# Patient Record
Sex: Male | Born: 1965 | Race: White | Hispanic: No | Marital: Married | Smoking: Former smoker
Health system: Southern US, Community
[De-identification: ages and names within clinical notes are randomized; demographics above are authoritative.]

## PROBLEM LIST (undated history)

## (undated) DIAGNOSIS — F419 Anxiety disorder, unspecified: Secondary | ICD-10-CM

## (undated) DIAGNOSIS — K219 Gastro-esophageal reflux disease without esophagitis: Secondary | ICD-10-CM

## (undated) DIAGNOSIS — I10 Essential (primary) hypertension: Secondary | ICD-10-CM

## (undated) DIAGNOSIS — Z87891 Personal history of nicotine dependence: Secondary | ICD-10-CM

## (undated) DIAGNOSIS — C439 Malignant melanoma of skin, unspecified: Secondary | ICD-10-CM

## (undated) HISTORY — DX: Personal history of nicotine dependence: Z87.891

## (undated) HISTORY — DX: Gastro-esophageal reflux disease without esophagitis: K21.9

## (undated) HISTORY — DX: Malignant melanoma of skin, unspecified: C43.9

## (undated) HISTORY — PX: SKIN CANCER EXCISION: SHX779

## (undated) HISTORY — DX: Anxiety disorder, unspecified: F41.9

---

## 2009-11-25 ENCOUNTER — Emergency Department (HOSPITAL_COMMUNITY): Admission: EM | Admit: 2009-11-25 | Discharge: 2009-11-25 | Payer: Self-pay | Admitting: Family Medicine

## 2011-01-03 LAB — POCT I-STAT, CHEM 8
BUN: 8 mg/dL (ref 6–23)
Calcium, Ion: 1.09 mmol/L — ABNORMAL LOW (ref 1.12–1.32)
TCO2: 23 mmol/L (ref 0–100)

## 2011-10-14 ENCOUNTER — Emergency Department (INDEPENDENT_AMBULATORY_CARE_PROVIDER_SITE_OTHER)
Admission: EM | Admit: 2011-10-14 | Discharge: 2011-10-14 | Disposition: A | Discharge status: Home / Self Care | Payer: Self-pay | Source: Home / Self Care

## 2011-10-14 ENCOUNTER — Other Ambulatory Visit: Payer: Self-pay

## 2011-10-14 ENCOUNTER — Encounter: Payer: Self-pay | Admitting: *Deleted

## 2011-10-14 DIAGNOSIS — R9431 Abnormal electrocardiogram [ECG] [EKG]: Secondary | ICD-10-CM

## 2011-10-14 DIAGNOSIS — J029 Acute pharyngitis, unspecified: Secondary | ICD-10-CM

## 2011-10-14 DIAGNOSIS — H109 Unspecified conjunctivitis: Secondary | ICD-10-CM

## 2011-10-14 DIAGNOSIS — I10 Essential (primary) hypertension: Secondary | ICD-10-CM

## 2011-10-14 HISTORY — DX: Essential (primary) hypertension: I10

## 2011-10-14 LAB — POCT RAPID STREP A: Streptococcus, Group A Screen (Direct): NEGATIVE

## 2011-10-14 MED ORDER — SULFACETAMIDE SODIUM 10 % OP SOLN: OPHTHALMIC | Status: DC

## 2011-10-14 NOTE — ED Provider Notes (Signed)
History     CSN: [id number]  Arrival date & time 10/14/11  0840   None     Chief Complaint  Patient presents with  . Sore Throat    (Consider location/radiation/quality/duration/timing/severity/associated sxs/prior treatment) HPI Comments: Pt presents with c/o sore throat and Lt eye redness. Sore throat began 3 days ago and is worse with swallowing. No fever, chills, nasal congestion or post nasal drainage. Lt eye turned red yesterday and is crusted shut in mornings. No daytime drainage, pain or visual change. His BP is noted to be elevated. Pt has a hx of HTN. He was seen at this urgent care Feb 2011 with HTN and had and abn EKG. He was advised to f/u with cardiology.  Pt states he did not take the BP medications as prescribed, and admits that he still has them at home. He also did not f/u with a PCP or Cardiologist. He denies chest pain, palpitations or dyspnea.    Past Medical History  Diagnosis Date  . Hypertension     History reviewed. No pertinent past surgical history.  History reviewed. No pertinent family history.  History  Substance Use Topics  . Smoking status: Current Everyday Smoker  . Smokeless tobacco: Not on file  . Alcohol Use: Yes     socially      Review of Systems  Constitutional: Negative for fever, chills and fatigue.  HENT: Positive for sore throat. Negative for ear pain, rhinorrhea, trouble swallowing and postnasal drip.   Eyes: Positive for redness. Negative for photophobia, pain, itching and visual disturbance.  Respiratory: Negative for cough and shortness of breath.   Cardiovascular: Negative for chest pain, palpitations and leg swelling.    Allergies  Review of patient's allergies indicates no known allergies.  Home Medications   Current Outpatient Rx  Name Route Sig Dispense Refill  . SULFACETAMIDE SODIUM 10 % OP SOLN  2 gtts TID Lt eye x 7 d 5 mL 0    BP 152/100  Pulse 72  Temp(Src) 98.2 F (36.8 C) (Oral)  Resp 20  SpO2  97%  Physical Exam  Nursing note and vitals reviewed. Constitutional: He appears well-developed and well-nourished. No distress.  HENT:  Head: Normocephalic and atraumatic.  Right Ear: Tympanic membrane, external ear and ear canal normal.  Left Ear: Tympanic membrane, external ear and ear canal normal.  Nose: Nose normal.  Mouth/Throat: Uvula is midline and mucous membranes are normal. No oral lesions. No uvula swelling. Posterior oropharyngeal erythema present. No oropharyngeal exudate, posterior oropharyngeal edema or tonsillar abscesses.  Eyes: EOM and lids are normal. Pupils are equal, round, and reactive to light. Right conjunctiva is not injected. Left conjunctiva is injected.  Neck: Neck supple.  Cardiovascular: Normal rate, regular rhythm and normal heart sounds.   Pulmonary/Chest: Effort normal and breath sounds normal. No respiratory distress.  Lymphadenopathy:    He has no cervical adenopathy.  Neurological: He is alert.  Skin: Skin is warm and dry.  Psychiatric: He has a normal mood and affect.    ED Course  Procedures (including critical care time)   Labs Reviewed  POCT RAPID STREP A ([facility] ONLY)   No results found.   1. Acute pharyngitis   2. Conjunctivitis of left eye   3. Hypertension   4. Abnormal EKG       MDM  11-25-09 EKG sinus tachycardia, Incomplete RBBB, Lt anterior fascicular block. Today's EKG Sinus bradycardia, Lt axis deviation, Incomplete RBBB. Hx of HTN, medication noncompliance  and f/u noncompliance. Discussed risks of untreated HTN. Also encouraged cardiology f/u of EKG changes.  Rapid strep neg.        Melody Comas, [place] 10/20/11 1248

## 2011-10-14 NOTE — ED Notes (Signed)
Pt c/o sorethroat onset 3 days ago and now with left eye being matted in the mornings.  Throat red, swollen.  No fever that he is aware of.  Left slightly red, denies itching or irritation.

## 2011-10-20 NOTE — ED Provider Notes (Signed)
Medical screening examination/treatment/procedure(s) were performed by non-physician practitioner and as supervising physician I was immediately available for consultation/collaboration.  Luiz Blare MD   Luiz Blare, MD 10/20/11 [phone]

## 2015-02-14 ENCOUNTER — Encounter: Payer: Self-pay | Admitting: Family Medicine

## 2015-02-14 ENCOUNTER — Ambulatory Visit (INDEPENDENT_AMBULATORY_CARE_PROVIDER_SITE_OTHER): Payer: 59 | Admitting: Family Medicine

## 2015-02-14 VITALS — BP 146/84 | HR 76 | Temp 98.1°F | Resp 16 | Ht 71.0 in | Wt 224.0 lb

## 2015-02-14 DIAGNOSIS — M545 Low back pain, unspecified: Secondary | ICD-10-CM

## 2015-02-14 DIAGNOSIS — I10 Essential (primary) hypertension: Secondary | ICD-10-CM | POA: Diagnosis not present

## 2015-02-14 DIAGNOSIS — F418 Other specified anxiety disorders: Secondary | ICD-10-CM

## 2015-02-14 MED ORDER — ALPRAZOLAM 1 MG PO TABS: 1.0000 mg | ORAL_TABLET | Freq: Two times a day (BID) | ORAL | Status: DC | PRN

## 2015-02-14 MED ORDER — CYCLOBENZAPRINE HCL 10 MG PO TABS: 10.0000 mg | ORAL_TABLET | Freq: Three times a day (TID) | ORAL | Status: DC | PRN

## 2015-02-14 MED ORDER — LOSARTAN POTASSIUM 50 MG PO TABS: 50.0000 mg | ORAL_TABLET | Freq: Every day | ORAL | Status: DC
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
## 2015-02-15 ENCOUNTER — Other Ambulatory Visit: Payer: 59

## 2015-02-15 DIAGNOSIS — I10 Essential (primary) hypertension: Secondary | ICD-10-CM

## 2015-02-15 LAB — COMPLETE METABOLIC PANEL WITH GFR
ALT: 22 U/L (ref 0–53)
AST: 20 U/L (ref 0–37)
Albumin: 4.1 g/dL (ref 3.5–5.2)
Alkaline Phosphatase: 78 U/L (ref 39–117)
BUN: 9 mg/dL (ref 6–23)
CHLORIDE: 104 meq/L (ref 96–112)
CO2: 23 mEq/L (ref 19–32)
CREATININE: 0.76 mg/dL (ref 0.50–1.35)
Calcium: 8.6 mg/dL (ref 8.4–10.5)
GLUCOSE: 92 mg/dL (ref 70–99)
Potassium: 4.3 mEq/L (ref 3.5–5.3)
Sodium: 137 mEq/L (ref 135–145)
TOTAL PROTEIN: 6.2 g/dL (ref 6.0–8.3)
Total Bilirubin: 0.5 mg/dL (ref 0.2–1.2)

## 2015-02-15 LAB — CBC WITH DIFFERENTIAL/PLATELET
BASOS ABS: 0.1 10*3/uL (ref 0.0–0.1)
Basophils Relative: 1 % (ref 0–1)
EOS ABS: 0.4 10*3/uL (ref 0.0–0.7)
EOS PCT: 7 % — AB (ref 0–5)
HEMATOCRIT: 51.6 % (ref 39.0–52.0)
HEMOGLOBIN: 17.5 g/dL — AB (ref 13.0–17.0)
LYMPHS ABS: 1.6 10*3/uL (ref 0.7–4.0)
Lymphocytes Relative: 29 % (ref 12–46)
MCH: 32.4 pg (ref 26.0–34.0)
MCHC: 33.9 g/dL (ref 30.0–36.0)
MCV: 95.6 fL (ref 78.0–100.0)
MPV: 10.5 fL (ref 8.6–12.4)
Monocytes Absolute: 0.3 10*3/uL (ref 0.1–1.0)
Monocytes Relative: 6 % (ref 3–12)
Neutro Abs: 3.1 10*3/uL (ref 1.7–7.7)
Neutrophils Relative %: 57 % (ref 43–77)
PLATELETS: 181 10*3/uL (ref 150–400)
RBC: 5.4 MIL/uL (ref 4.22–5.81)
RDW: 13.5 % (ref 11.5–15.5)
WBC: 5.4 10*3/uL (ref 4.0–10.5)

## 2015-02-15 LAB — LIPID PANEL
CHOL/HDL RATIO: 4.6 ratio
Cholesterol: 204 mg/dL — ABNORMAL HIGH (ref 0–200)
HDL: 44 mg/dL (ref >=40)
LDL Cholesterol: 144 mg/dL — ABNORMAL HIGH (ref 0–99)
TRIGLYCERIDES: 81 mg/dL (ref <150)
VLDL: 16 mg/dL (ref 0–40)

## 2015-02-16 ENCOUNTER — Encounter: Payer: Self-pay | Admitting: Family Medicine

## 2015-03-10 ENCOUNTER — Other Ambulatory Visit: Payer: Self-pay | Admitting: Family Medicine

## 2015-03-10 NOTE — Telephone Encounter (Signed)
[place] with refill, but he is using the xanax more frequently than anticipated.  If needing it everyday, I would like to see him to discuss starting him on a preventative medicine like an SSRI.

## 2015-03-10 NOTE — Telephone Encounter (Signed)
Both refilled 02/14/15 #30 each  LOV 02/14/15  Ok refill?

## 2015-03-14 ENCOUNTER — Telehealth: Payer: Self-pay | Admitting: Family Medicine

## 2015-03-14 NOTE — Telephone Encounter (Signed)
?   OK to Refill  

## 2015-03-14 NOTE — Telephone Encounter (Signed)
Susy Frizzle, MD at 03/10/2015 9:48 AM     Status: Signed        Ok with refill, but he is using the xanax more frequently than anticipated. If needing it everyday, I would like to see him to discuss starting him on a preventative medicine like an SSRI.       Called pt and spoke to wife and informed her pt needed appt and med called in to pharm. She states that she will have him call at lunch to schedule that appt.

## 2015-03-14 NOTE — Telephone Encounter (Signed)
ok 

## 2015-03-17 ENCOUNTER — Encounter: Payer: Self-pay | Admitting: Family Medicine

## 2015-03-17 ENCOUNTER — Ambulatory Visit (INDEPENDENT_AMBULATORY_CARE_PROVIDER_SITE_OTHER): Payer: 59 | Admitting: Family Medicine

## 2015-03-17 VITALS — BP 140/90 | HR 84 | Temp 98.1°F | Resp 20 | Wt 220.0 lb

## 2015-03-17 DIAGNOSIS — M545 Low back pain, unspecified: Secondary | ICD-10-CM

## 2015-03-17 DIAGNOSIS — I10 Essential (primary) hypertension: Secondary | ICD-10-CM

## 2015-03-17 MED ORDER — DICLOFENAC SODIUM 75 MG PO TBEC: 75.0000 mg | DELAYED_RELEASE_TABLET | Freq: Two times a day (BID) | ORAL | Status: DC | PRN
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
## 2015-03-24 ENCOUNTER — Encounter: Payer: Self-pay | Admitting: Family Medicine

## 2015-03-24 ENCOUNTER — Telehealth: Payer: Self-pay | Admitting: Family Medicine

## 2015-03-24 NOTE — Telephone Encounter (Signed)
Patient aware via mychart

## 2015-03-24 NOTE — Telephone Encounter (Signed)
Excellent

## 2015-03-24 NOTE — Telephone Encounter (Signed)
Patient calling to give you blood pressure reading for yesterday  138/80

## 2015-04-04 ENCOUNTER — Other Ambulatory Visit: Payer: Self-pay | Admitting: Family Medicine

## 2015-04-04 NOTE — Telephone Encounter (Signed)
ok 

## 2015-04-04 NOTE — Telephone Encounter (Signed)
Ok to refill 

## 2015-04-04 NOTE — Telephone Encounter (Signed)
Medication refilled per protocol. 

## 2015-05-02 ENCOUNTER — Other Ambulatory Visit: Payer: Self-pay | Admitting: Family Medicine

## 2015-05-03 NOTE — Telephone Encounter (Signed)
Medication refilled per protocol. 

## 2015-05-22 ENCOUNTER — Encounter: Payer: Self-pay | Admitting: Family Medicine

## 2015-07-05 ENCOUNTER — Encounter: Payer: Self-pay | Admitting: Physician Assistant

## 2015-07-05 ENCOUNTER — Encounter: Payer: Self-pay | Admitting: Family Medicine

## 2015-07-05 ENCOUNTER — Ambulatory Visit (INDEPENDENT_AMBULATORY_CARE_PROVIDER_SITE_OTHER): Payer: 59 | Admitting: Physician Assistant

## 2015-07-05 VITALS — BP 110/74 | HR 80 | Temp 98.1°F | Resp 16 | Ht 71.0 in | Wt 220.0 lb

## 2015-07-05 DIAGNOSIS — M5136 Other intervertebral disc degeneration, lumbar region: Secondary | ICD-10-CM | POA: Insufficient documentation

## 2015-07-05 DIAGNOSIS — M545 Low back pain, unspecified: Secondary | ICD-10-CM

## 2015-07-05 MED ORDER — HYDROCODONE-ACETAMINOPHEN 5-325 MG PO TABS: 1.0000 | ORAL_TABLET | Freq: Four times a day (QID) | ORAL | Status: DC | PRN

## 2015-07-05 NOTE — Progress Notes (Signed)
Patient ID: Dennis Crosby MRN: [id number], DOB: Apr 10, 1966, 49 y.o. Date of Encounter: 07/05/2015, 3:05 PM    Chief Complaint:  Chief Complaint  Patient presents with  . Back Pain    ? Pulled muscle     HPI: 49 y.o. year old white male presents with above.  I reviewed Dr. Samella Parr recent office notes from 02/14/15 and 03/17/15. At both of those visits patient was being seen for multiple medical issues, but one of them was low back pain.  At visit 02/14/15 patient reported episodic low back pain. 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 At that visit he reported he has used some of his wife's muscle relaxer in the past and he wanted a prescription of his own that he could use sparingly as needed for stiff muscles in his back. At that visit Dr. Dennard Schaumann prescribed Flexeril to use as needed. Follow-up visit 03/17/15 patient reported that he continue to have midline low back pain on a daily basis. It was not radiating into his legs or hips. Continue to attributed to his job. Reported that the Flexeril was not helping very much. At that visit Dr. Dennard Schaumann added diclofenac 75 mg twice a day when necessary back pain. Stated that if not better would proceed with x-rays of the low back.  Today patient states that he had specific injury to his back yesterday at work that caused sudden (acute) increase in his back pain. 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 Says that it was quite heavy and he was bent over holding to this. Developed pain in his right low back. Says that he had to leave work early yesterday and stayed out of work today secondary to this. States that yesterday after leaving work he came here but we were booked and could not see him yesterday so he was scheduled for her visit today. States that he has a constant achy  pain in the right low back. Says that also,  at times, it feels like it's going to "catch "if he turns certain ways.  Says that yesterday, at its worst, it was an 8. 5/10.  Says that today it is a 6. 5/10. States that he has been applying ice pack taking diclofenac Flexeril also mentions Advil or Aleve. Has been resting the back and stayed out of work today.  Has had no pain going down the leg at all. No numbness or tingling or paresthesias down the leg at all. No weakness in the leg or foot. Incontinence of bowel or bladder.     Home Meds:   Outpatient Prescriptions Prior to Visit  Medication Sig Dispense Refill  . ALPRAZolam (XANAX) 1 MG tablet TAKE 1 TABLET BY MOUTH TWICE A DAY AS NEEDED FOR ANXIETY 30 tablet 0  . cyclobenzaprine (FLEXERIL) 10 MG tablet TAKE 1 TABLET (10 MG TOTAL) BY MOUTH 3 (THREE) TIMES DAILY AS NEEDED FOR MUSCLE SPASMS. 30 tablet 0  . diclofenac (VOLTAREN) 75 MG EC tablet TAKE 1 TABLET (75 MG TOTAL) BY MOUTH 2 (TWO) TIMES DAILY AS NEEDED. 30 tablet 1  . losartan (COZAAR) 50 MG tablet Take 1 tablet (50 mg total) by mouth daily. 90 tablet 3   No facility-administered medications prior to visit.    Allergies: No Known Allergies    Review of Systems: See  HPI for pertinent ROS. All other ROS negative.    Physical Exam: Blood pressure 110/74, pulse 80, temperature 98.1 F (36.7 C), temperature source Oral, resp. rate 16, height 5\' 11"  (1.803 m), weight 220 lb (99.791 kg)., Body mass index is 30.7 kg/(m^2). General: Dennis Crosby.  Appears in no acute distress. Neck: Supple. No thyromegaly. No lymphadenopathy. Lungs: Clear bilaterally to auscultation without wheezes, rales, or rhonchi. Breathing is unlabored. Heart: Regular rhythm. No murmurs, rubs, or gallops. Msk:  Strength and tone normal for 49 Positive tenderness with palpation of right low back. Straight leg raise: He can raise the left leg to 45 but this does cause some discomfort in the right low back. He can  only raise the right leg minimally before it causes pain in the right low back. Left hip abduction--he can abduct 45 then it causes discomfort in the right low back. Right hip abduction--he can only abduct approximately 30 then stop secondary to discomfort in the right low back. 2+, equal patella reflexes bilaterally. 5/5 strength in both lower extremities and with flexion and dorsiflexion of feet bilaterally. Extremities/Skin: Warm and dry.  Neuro: Alert and oriented X 3. Moves all extremities spontaneously. Gait is normal. CNII-XII grossly in tact. Psych:  Responds to questions appropriately with a normal affect.     ASSESSMENT AND PLAN:  49 y.o. year old male with  1. Right-sided low back pain without sciatica He states that he does not need refill on Flexeril or diclofenac-- says that he already has enough of these. States that he only slept a little last night secondary to pain so I gave him #15 hydrocodone-- 5 mg-- to use as needed for significant pain mostly just at night. Gave note for out of work today through Friday with plans for return to work Monday. He says that if he returns to work Monday, his " boss won't make him do anything real hard and is good about working with him to do "light duty " " Follow-up if symptoms worsen or are not improved enough to be able to work Monday. I also discussed with him that Motrin and Advil Aleve all of these or NSAIDs and not to take these while taking diclofenac.   Dennis Crosby, [place], [facility] 07/05/2015 3:05 PM

## 2016-01-09 ENCOUNTER — Other Ambulatory Visit: Payer: Self-pay | Admitting: Family Medicine

## 2016-01-09 DIAGNOSIS — I10 Essential (primary) hypertension: Secondary | ICD-10-CM

## 2016-01-09 MED ORDER — LOSARTAN POTASSIUM 50 MG PO TABS: 50.0000 mg | ORAL_TABLET | Freq: Every day | ORAL | Status: DC

## 2016-01-09 NOTE — Telephone Encounter (Signed)
Medication called/sent to requested pharmacy  

## 2016-01-26 ENCOUNTER — Encounter: Payer: Self-pay | Admitting: Family Medicine

## 2016-01-26 ENCOUNTER — Other Ambulatory Visit: Payer: Self-pay | Admitting: Family Medicine

## 2016-01-26 ENCOUNTER — Ambulatory Visit (INDEPENDENT_AMBULATORY_CARE_PROVIDER_SITE_OTHER): Payer: BLUE CROSS/BLUE SHIELD | Admitting: Family Medicine

## 2016-01-26 ENCOUNTER — Ambulatory Visit
Admission: RE | Admit: 2016-01-26 | Discharge: 2016-01-26 | Disposition: A | Discharge status: Home / Self Care | Payer: BLUE CROSS/BLUE SHIELD | Source: Ambulatory Visit | Attending: Family Medicine | Admitting: Family Medicine

## 2016-01-26 VITALS — BP 132/74 | HR 78 | Temp 98.9°F | Resp 14 | Ht 71.0 in | Wt 214.0 lb

## 2016-01-26 DIAGNOSIS — Z720 Tobacco use: Secondary | ICD-10-CM

## 2016-01-26 DIAGNOSIS — F17200 Nicotine dependence, unspecified, uncomplicated: Secondary | ICD-10-CM

## 2016-01-26 DIAGNOSIS — R5383 Other fatigue: Secondary | ICD-10-CM | POA: Diagnosis not present

## 2016-01-26 DIAGNOSIS — Z833 Family history of diabetes mellitus: Secondary | ICD-10-CM

## 2016-01-26 DIAGNOSIS — R634 Abnormal weight loss: Secondary | ICD-10-CM

## 2016-01-26 DIAGNOSIS — R079 Chest pain, unspecified: Secondary | ICD-10-CM | POA: Diagnosis not present

## 2016-01-26 MED ORDER — ALPRAZOLAM 1 MG PO TABS: ORAL_TABLET | ORAL | Status: DC

## 2016-01-26 NOTE — Patient Instructions (Addendum)
[facility] to get chest xray  Take the xanax as needed for your nerves and stress We will call with lab results  F/U pending results

## 2016-01-26 NOTE — Progress Notes (Signed)
Patient ID: Dennis Crosby, male   DOB: May 24, 1966, 50 y.o.   MRN: [id number]    Subjective:    Patient ID: Dennis Crosby, male    DOB: 08-19-1966, 50 y.o.   MRN: [id number]  Patient presents for Illness  Patient here with generalized fatigue for the past few weeks. 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 His wife is also helping with caregiving. He does not sleep well in general. He has had Xanax in the past and did well with this. His wife is also a patient of mine. He feels like he is abnormally fatigued. He is also losing weight. He is unintentionally lost about 20-25 pounds in the past year. He states that his appetite is also decreased but he is not having any pain or nausea with eating. He's only had change in bowels last night when he had some diarrhea he only had one loose stool today and he had a headache earlier but that is now resolved. He does have family history of diabetes mellitus was very concerned about this. He does drink Coca-Cola daily to keep his energy up. He denies any recent illness no fever. He is a smoker and smokes now 10 cigarettes a day he was previously up to at least a pack or more per day.   Review Of Systems:  GEN- + fatigue, fever, +weight loss,weakness, recent illness HEENT- denies eye drainage, change in vision, nasal discharge, CVS- denies chest pain, palpitations RESP- denies SOB, cough, wheeze ABD- denies N/V, +change in stools, abd pain GU- denies dysuria, hematuria, dribbling, incontinence MSK- denies joint pain, muscle aches, injury Neuro- + headache, dizziness, syncope, seizure activity       Objective:    BP 132/74 mmHg  Pulse 78  Temp(Src) 98.9 F (37.2 C) (Oral)  Resp 14  Ht 5\' 11"  (1.803 m)  Wt 214 lb (97.07 kg)  BMI 29.86 kg/m2 GEN- NAD, alert and oriented x3,weight loss 10lbs since May 2016 HEENT- PERRL, EOMI, non injected sclera, pink  conjunctiva, MMM, oropharynx clear Neck- Supple, no thyromegaly CVS- RRR, no murmur RESP-CTAB ABD-NABS,soft,NT,ND Psych- a little depressed/stressed appearing, not anxious, very pleasant, no SI, normal speech, well groomed  Neuro- CNII -XII in tact, no focal deficits  EXT- No edema Pulses- Radial  2+        Assessment & Plan:      Problem List Items Addressed This Visit    None    Visit Diagnoses    Loss of weight    -  Primary    labs per above, CXR for smoking and weight loss, no family history of cancer except PGM with bone cancer Diarrhea may have been 24 hour virus now resolved     Relevant Orders    TSH    Hemoglobin A1c    Other fatigue        MTF with caregiving for mother, stress at work. poor eating habits. CHeck labs, TSH, will also check A1C with family history of DM.     Relevant Orders    CBC with Differential/Platelet    Comprehensive metabolic panel    TSH    Vitamin B12    Family history of diabetes mellitus        Relevant Orders    Hemoglobin A1c    Smoker        counsled on tobacco cessation  Note: This dictation was prepared with Dragon dictation along with smaller phrase technology. Any transcriptional errors that result from this process are unintentional.

## 2016-01-27 LAB — COMPREHENSIVE METABOLIC PANEL
ALT: 15 U/L (ref 9–46)
AST: 16 U/L (ref 10–40)
Albumin: 4.2 g/dL (ref 3.6–5.1)
Alkaline Phosphatase: 77 U/L (ref 40–115)
BUN: 11 mg/dL (ref 7–25)
CHLORIDE: 101 mmol/L (ref 98–110)
CO2: 23 mmol/L (ref 20–31)
CREATININE: 0.74 mg/dL (ref 0.60–1.35)
Calcium: 8.7 mg/dL (ref 8.6–10.3)
GLUCOSE: 84 mg/dL (ref 70–99)
POTASSIUM: 4.2 mmol/L (ref 3.5–5.3)
SODIUM: 137 mmol/L (ref 135–146)
TOTAL PROTEIN: 6.3 g/dL (ref 6.1–8.1)
Total Bilirubin: 0.8 mg/dL (ref 0.2–1.2)

## 2016-01-27 LAB — CBC WITH DIFFERENTIAL/PLATELET
BASOS ABS: 0 {cells}/uL (ref 0–200)
Basophils Relative: 0 %
EOS ABS: 345 {cells}/uL (ref 15–500)
Eosinophils Relative: 3 %
HCT: 47.9 % (ref 38.5–50.0)
Hemoglobin: 16.5 g/dL (ref 13.0–17.0)
LYMPHS ABS: 1725 {cells}/uL (ref 850–3900)
Lymphocytes Relative: 15 %
MCH: 33.5 pg — AB (ref 27.0–33.0)
MCHC: 34.4 g/dL (ref 32.0–36.0)
MCV: 97.2 fL (ref 80.0–100.0)
MONO ABS: 1150 {cells}/uL — AB (ref 200–950)
MPV: 10 fL (ref 7.5–12.5)
Monocytes Relative: 10 %
NEUTROS PCT: 72 %
Neutro Abs: 8280 cells/uL — ABNORMAL HIGH (ref 1500–7800)
PLATELETS: 189 10*3/uL (ref 140–400)
RBC: 4.93 MIL/uL (ref 4.20–5.80)
RDW: 13 % (ref 11.0–15.0)
WBC: 11.5 10*3/uL — AB (ref 3.8–10.8)

## 2016-01-27 LAB — TSH: TSH: 1.07 m[IU]/L (ref 0.40–4.50)

## 2016-01-27 LAB — HEMOGLOBIN A1C
Hgb A1c MFr Bld: 5.5 % (ref <5.7)
Mean Plasma Glucose: 111 mg/dL

## 2016-01-27 LAB — VITAMIN B12: VITAMIN B 12: 412 pg/mL (ref 200–1100)

## 2016-01-29 ENCOUNTER — Encounter: Payer: Self-pay | Admitting: Family Medicine

## 2016-01-30 ENCOUNTER — Ambulatory Visit (INDEPENDENT_AMBULATORY_CARE_PROVIDER_SITE_OTHER): Payer: BLUE CROSS/BLUE SHIELD | Admitting: Family Medicine

## 2016-01-30 ENCOUNTER — Encounter: Payer: Self-pay | Admitting: Family Medicine

## 2016-01-30 ENCOUNTER — Other Ambulatory Visit: Payer: Self-pay | Admitting: Family Medicine

## 2016-01-30 VITALS — BP 130/74 | HR 80 | Temp 102.2°F | Resp 16 | Ht 71.0 in | Wt 212.0 lb

## 2016-01-30 DIAGNOSIS — R51 Headache: Secondary | ICD-10-CM | POA: Diagnosis not present

## 2016-01-30 DIAGNOSIS — R42 Dizziness and giddiness: Secondary | ICD-10-CM | POA: Diagnosis not present

## 2016-01-30 DIAGNOSIS — R509 Fever, unspecified: Secondary | ICD-10-CM | POA: Diagnosis not present

## 2016-01-30 DIAGNOSIS — R519 Headache, unspecified: Secondary | ICD-10-CM

## 2016-01-30 LAB — CBC
HCT: 45.1 % (ref 38.5–50.0)
HEMOGLOBIN: 15.8 g/dL (ref 13.0–17.0)
MCH: 33.5 pg — AB (ref 27.0–33.0)
MCHC: 35 g/dL (ref 32.0–36.0)
MCV: 95.8 fL (ref 80.0–100.0)
MPV: 9.8 fL (ref 7.5–12.5)
Platelets: 217 10*3/uL (ref 140–400)
RBC: 4.71 MIL/uL (ref 4.20–5.80)
RDW: 12.4 % (ref 11.0–15.0)
WBC: 10.8 10*3/uL (ref 3.8–10.8)

## 2016-01-30 LAB — STREP GROUP A AG, W/REFLEX TO CULT: STREGTOCOCCUS GROUP A AG SCREEN: NOT DETECTED

## 2016-01-30 LAB — INFLUENZA A AND B AG, IMMUNOASSAY
Influenza A Antigen: NOT DETECTED
Influenza B Antigen: NOT DETECTED

## 2016-01-30 MED ORDER — ACETAMINOPHEN 500 MG PO TABS
500.0000 mg | ORAL_TABLET | Freq: Once | ORAL | Status: AC
  Administered 2016-01-30: 500 mg via ORAL

## 2016-01-30 MED ORDER — AZITHROMYCIN 250 MG PO TABS: ORAL_TABLET | ORAL | Status: DC

## 2016-01-30 MED ORDER — KETOROLAC TROMETHAMINE 60 MG/2ML IM SOLN
60.0000 mg | Freq: Once | INTRAMUSCULAR | Status: AC
  Administered 2016-01-30: 60 mg via INTRAMUSCULAR

## 2016-01-30 MED ORDER — IBUPROFEN 600 MG PO TABS: 600.0000 mg | ORAL_TABLET | Freq: Three times a day (TID) | ORAL | Status: DC | PRN

## 2016-01-30 MED ORDER — PROMETHAZINE HCL 25 MG/ML IJ SOLN
25.0000 mg | Freq: Once | INTRAMUSCULAR | Status: AC
  Administered 2016-01-30: 25 mg via INTRAMUSCULAR

## 2016-01-30 NOTE — Patient Instructions (Addendum)
Today you were given, Tylenol for fever. Toradol shot, phenergan shot for headache  Keep up with fluids and eat small amounts Continue tylenol and alternate with ibuprofen for fever and headache  Take antibiotics as prescribed  Flu test NEG Strep Test Neg CBC- normal infection fighting cells  F/U if not improved

## 2016-01-30 NOTE — Progress Notes (Signed)
Patient ID: Melford Sawhney, male   DOB: 1966-02-15, 50 y.o.   MRN: [id number]    Subjective:    Patient ID: Aayaan Al, male    DOB: 07-14-1966, 50 y.o.   MRN: [id number]  Patient presents for Illness   Pt here for f/u visit, he was seen last week with Fatigue he also had some diarrhea for about 24 hours. He states he has lost weight 20-25 pounds in the past year unintentionally. He had had some decreased appetite but no nausea or vomiting at that time. He also had a light headache but that had resolved. He did not complain of any dizziness. His labs were fairly unremarkable his white blood cell count was mildly elevated at 11.5. Through the weekend evidently he had more difficulties with dizziness but not vomiting no diarrhea. His wife sent an email states that he was weaker. He is here today for this follow-up.  The only new medication prescribed was alprazolam he is continuing on his losartan. He never took the xanax. He took ibuprofen for the headache, felt feverish through the weekend, also drank coffee which helped his headache. He tried to go to work [employer] and [employer] and was sent home.     Review Denies- fatigue, +fever, weight loss,weakness, recent illness HEENT- denies eye drainage, change in vision, nasal discharge, CVS- denies chest pain, palpitations RESP- denies SOB, cough, wheeze ABD- denies N/V, change in stools, abd pain GU- denies dysuria, hematuria, dribbling, incontinence MSK- denies joint pain, muscle aches, injury Neuro- + headache, +dizziness, syncope, seizure activity       Objective:    BP 130/74 mmHg  Pulse 80  Temp(Src) 102.2 F (39 C) (Oral)  Resp 16  Ht 5\' 11"  (1.803 m)  Wt 212 lb (96.163 kg)  BMI 29.58 kg/m2  SpO2 97% GEN- NAD, alert and oriented x3, HEENT- PERRL, EOMI, non injected sclera, pink conjunctiva, MMM, oropharynx clear, TM clear, no maxillary sinus tenderness  Neck- Supple, FROM, no rigidity, no LAD  CVS- RRR, no  murmur RESP-CTAB ABD-NABS,soft,NT,ND Neuro- CNII -XII in tact, no focal deficits , no menigeal signs,gait normal EXT- No edema Pulses- Radial  2+       Assessment & Plan:      Problem List Items Addressed This Visit    None    Visit Diagnoses    Dizziness and giddiness    -  Primary    Relevant Orders    CBC w/MCH & 3 Part Diff    Other specified fever        He is high fever concerned this was viral illness he may be developing some sinusitis. There are no signs of meningitis which I was concerned with fever and headache.Strep was negative fluid with negative recent chest x-ray negative he likely had some viral illnesses as started with his diarrhea now it is sooner with headache and fever. Start antibiotics more atypical possible sinusitis that is going on though he does not have any classic symptoms. He is not having any confusion otherwise. No neurological deficits. I have recommended to him and his wife that if his symptoms persist that he needs to go to the emergency room for further workup.     Relevant Orders    STREP GROUP A AG, W/REFLEX TO CULT (Completed)    Influenza A and B Ag, Immunoassay (Completed)    Acute intractable headache, unspecified headache type        Toradol and Phenergan given in office. We'll send home  with ibuprofen 600 mg.    Relevant Medications    ketorolac (TORADOL) injection 60 mg (Completed)    promethazine (PHENERGAN) injection 25 mg (Completed)    acetaminophen (TYLENOL) tablet 500 mg (Completed)    ibuprofen (ADVIL,MOTRIN) 600 MG tablet       Note: This dictation was prepared with Dragon dictation along with smaller phrase technology. Any transcriptional errors that result from this process are unintentional.

## 2016-02-01 LAB — CULTURE, GROUP A STREP: ORGANISM ID, BACTERIA: NORMAL

## 2016-02-05 ENCOUNTER — Ambulatory Visit: Payer: Self-pay | Admitting: Family Medicine

## 2016-02-06 ENCOUNTER — Encounter: Payer: Self-pay | Admitting: Family Medicine

## 2016-02-29 ENCOUNTER — Other Ambulatory Visit: Payer: Self-pay | Admitting: Family Medicine

## 2016-03-01 NOTE — Telephone Encounter (Signed)
Ok to refill??  Last office visit 01/30/2016.  Last refill 01/26/2016.

## 2016-03-01 NOTE — Telephone Encounter (Signed)
Medication called to pharmacy. 

## 2016-03-01 NOTE — Telephone Encounter (Signed)
ok 

## 2016-04-05 ENCOUNTER — Other Ambulatory Visit: Payer: Self-pay | Admitting: Family Medicine

## 2016-04-05 NOTE — Telephone Encounter (Signed)
Medication called to pharmacy. 

## 2016-04-05 NOTE — Telephone Encounter (Signed)
ok 

## 2016-04-05 NOTE — Telephone Encounter (Signed)
Ok to refill??  Last office visit 01/30/2016.  Last refill 03/01/2016.

## 2016-05-24 ENCOUNTER — Other Ambulatory Visit: Payer: Self-pay | Admitting: Family Medicine

## 2016-05-27 NOTE — Telephone Encounter (Signed)
Ok to refill??      LOV 01/30/16  LRF 04/05/13

## 2016-05-27 NOTE — Telephone Encounter (Signed)
Okay to refill? 

## 2016-05-28 NOTE — Telephone Encounter (Signed)
Medication called to pharmacy. 

## 2016-06-07 ENCOUNTER — Encounter: Payer: Self-pay | Admitting: Family Medicine

## 2016-06-07 ENCOUNTER — Ambulatory Visit (INDEPENDENT_AMBULATORY_CARE_PROVIDER_SITE_OTHER): Payer: BLUE CROSS/BLUE SHIELD | Admitting: Family Medicine

## 2016-06-07 DIAGNOSIS — F329 Major depressive disorder, single episode, unspecified: Secondary | ICD-10-CM | POA: Insufficient documentation

## 2016-06-07 DIAGNOSIS — F418 Other specified anxiety disorders: Secondary | ICD-10-CM | POA: Diagnosis not present

## 2016-06-07 MED ORDER — SERTRALINE HCL 25 MG PO TABS: ORAL_TABLET | ORAL | 2 refills | Status: DC

## 2016-06-07 NOTE — Assessment & Plan Note (Signed)
Plan to start zoloft 25mg  and titrate to 50mg  at bedtime, to help with both depressed symptoms and anxiety. He will use xanax as bridge for sleep for now No SI Reassess in 4 weeks Hopefully changes at work will improve his current situation and financial stability.  Note his wife had to go back to work part time to help with bills which also stresses him and he was sole provider

## 2016-06-07 NOTE — Progress Notes (Signed)
   Subjective:    Patient ID: Dennis Crosby, male    DOB: 10-27-1965, 50 y.o.   MRN: [id number]  Patient presents for Depression/ Fatigue (reports increased anxiety and depression with fatigue- states that he has been taking anxiety meds with mild relief)  Pt here with increased anxiety and feelings of depression. He and wife are caring for his mother, he is also working full time. He has a lot of stress at working trying to meet work demands and has taken a paycut as a results. He finds himself walking on eggshells at work and at home and recently things just seem to have come to a head. He will act Out of anger which is not his typical personality other times he will just find himself tearful and crying to himself. He is not sleeping very well. His appetite is okay. He's never been on any antidepressants in the past but willing to try something to help with his mood. He does note that his boss spoke with him this week and they're going to try to restructure some things to help with his pay.    Currently on xanax 1mg  as needed , he typically takes after work and this definitely helps calm him down but he runs out of his system and by the morning he is already on edge before even going in.     Review Of Systems:  GEN- denies fatigue, fever, weight loss,weakness, recent illness HEENT- denies eye drainage, change in vision, nasal discharge, CVS- denies chest pain, palpitations RESP- denies SOB, cough, wheeze ABD- denies N/V, change in stools, abd pain GU- denies dysuria, hematuria, dribbling, incontinence MSK- denies joint pain, muscle aches, injury Neuro- denies headache, dizziness, syncope, seizure activity       Objective:    BP 138/82 (BP Location: Left Arm, Patient Position: Sitting, Cuff Size: Large)   Pulse 76   Temp 98.1 F (36.7 C) (Oral)   Resp 14   Ht 5\' 11"  (1.803 m)   Wt 220 lb (99.8 kg)   BMI 30.68 kg/m  GEN- NAD, alert and oriented x3 Psych- very pleasant, depressed  affect, tearful during visit, no SI, no hallucinations, thought process in tact         Assessment & Plan:      Problem List Items Addressed This Visit    Depression with anxiety    Plan to start zoloft 25mg  and titrate to 50mg  at bedtime, to help with both depressed symptoms and anxiety. He will use xanax as bridge for sleep for now No SI Reassess in 4 weeks Hopefully changes at work will improve his current situation and financial stability.  Note his wife had to go back to work part time to help with bills which also stresses him and he was sole provider        Other Visit Diagnoses   None.     Note: This dictation was prepared with Dragon dictation along with smaller phrase technology. Any transcriptional errors that result from this process are unintentional.

## 2016-06-07 NOTE — Patient Instructions (Signed)
F/U 4 weeks  

## 2016-07-03 ENCOUNTER — Other Ambulatory Visit: Payer: Self-pay | Admitting: Family Medicine

## 2016-07-03 NOTE — Telephone Encounter (Signed)
Ok to refill 

## 2016-07-04 NOTE — Telephone Encounter (Signed)
ok 

## 2016-07-04 NOTE — Telephone Encounter (Signed)
Medication called to pharmacy. 

## 2016-07-05 ENCOUNTER — Other Ambulatory Visit: Payer: Self-pay | Admitting: *Deleted

## 2016-07-05 MED ORDER — SERTRALINE HCL 25 MG PO TABS: ORAL_TABLET | ORAL | 2 refills | Status: DC

## 2016-07-05 NOTE — Telephone Encounter (Signed)
Received fax requesting refill on Zoloft with 90day supply.   Prescription sent to pharmacy.

## 2016-08-02 ENCOUNTER — Other Ambulatory Visit: Payer: Self-pay | Admitting: Family Medicine

## 2016-08-02 DIAGNOSIS — I10 Essential (primary) hypertension: Secondary | ICD-10-CM

## 2016-08-02 NOTE — Telephone Encounter (Signed)
Ok to refill??      Xanax 

## 2016-08-02 NOTE — Telephone Encounter (Signed)
Medication called to pharmacy. 

## 2016-08-02 NOTE — Telephone Encounter (Signed)
ok 

## 2016-09-03 ENCOUNTER — Other Ambulatory Visit: Payer: Self-pay | Admitting: Family Medicine

## 2016-09-03 NOTE — Telephone Encounter (Signed)
Ok to refill 

## 2016-09-04 NOTE — Telephone Encounter (Signed)
ok 

## 2016-09-25 ENCOUNTER — Other Ambulatory Visit: Payer: Self-pay | Admitting: Family Medicine

## 2016-09-25 NOTE — Telephone Encounter (Signed)
Ok to refill??      LOV - 06/07/16 LRF - 09/04/16 At LOV you wanted him to f/u in 4 weeks.

## 2016-09-25 NOTE — Telephone Encounter (Signed)
Give 1 refill advise needs OV

## 2016-09-26 NOTE — Telephone Encounter (Signed)
Medication called to pharmacy. 

## 2016-10-22 ENCOUNTER — Other Ambulatory Visit: Payer: Self-pay | Admitting: Family Medicine

## 2016-10-22 NOTE — Telephone Encounter (Signed)
Ok to refill 

## 2016-10-22 NOTE — Telephone Encounter (Signed)
ok 

## 2016-10-22 NOTE — Telephone Encounter (Signed)
Medication called to pharmacy. 

## 2016-11-15 ENCOUNTER — Other Ambulatory Visit: Payer: Self-pay | Admitting: Family Medicine

## 2016-11-18 NOTE — Telephone Encounter (Signed)
ok 

## 2016-11-18 NOTE — Telephone Encounter (Signed)
Ok to refill 

## 2017-01-20 ENCOUNTER — Ambulatory Visit (INDEPENDENT_AMBULATORY_CARE_PROVIDER_SITE_OTHER): Payer: BLUE CROSS/BLUE SHIELD | Admitting: Family Medicine

## 2017-01-20 ENCOUNTER — Encounter: Payer: Self-pay | Admitting: Family Medicine

## 2017-01-20 VITALS — BP 134/72 | HR 82 | Temp 98.6°F | Resp 14 | Ht 71.0 in | Wt 236.0 lb

## 2017-01-20 DIAGNOSIS — D229 Melanocytic nevi, unspecified: Secondary | ICD-10-CM

## 2017-01-20 DIAGNOSIS — F418 Other specified anxiety disorders: Secondary | ICD-10-CM | POA: Diagnosis not present

## 2017-01-20 DIAGNOSIS — I10 Essential (primary) hypertension: Secondary | ICD-10-CM | POA: Diagnosis not present

## 2017-01-20 MED ORDER — ALPRAZOLAM 1 MG PO TABS: ORAL_TABLET | ORAL | 2 refills | Status: DC

## 2017-01-20 MED ORDER — ESCITALOPRAM OXALATE 10 MG PO TABS: 10.0000 mg | ORAL_TABLET | Freq: Every day | ORAL | 3 refills | Status: DC

## 2017-01-20 NOTE — Assessment & Plan Note (Signed)
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 He did not have good side effects with the Zoloft was a try something different. He is still taking the alprazolam up warts of 3 times a day. I'm then increase his alprazolam to 1 mg 3 times a day which is basically how he has been taking a few days a week. I will also start him on Lexapro 10 mg at bedtime. We'll follow-up in 6 weeks. This is a difficult situation for both him and his wife financially and emotionally.

## 2017-01-20 NOTE — Progress Notes (Signed)
Subjective:    Patient ID: Dennis Crosby, male    DOB: July 21, 1966, 51 y.o.   MRN: [id number]  Patient presents for Depression/ Anxiety (states that "nerves" are bad, has fatigue) and Change to Neoplasm (moles on chest and nose changing in shape and color)   Depression/anxiety- Patient here to follow-up depression and anxiety. He was last seen in August of last year started on Zoloft 25 mg and titrated to 50 mg. He was also using alprazolam as a bridge. He stopped the Zoloft as he felt like a zombie and that it was not helping he took for about 4 weeks. He has not followed up since then. He was having some financial issues which his wife had to return to work which was very stressful for both of them. No wife is also here for appointment for similar symptoms He admits that he is still on edge and feels depressed. He does not want to go to work he does not want to come home and she has to deal with his mother and being her caregiver. This is affecting his marriage which his wife also admits. To try something different to help with his nerves. He is not sleeping very well unless he takes 2 Xanax at bedtime.  Unfortunatey they cannot afford help for his mother will as she has property selling her name is a very complicated power of attorney and financial issue between him and his siblings.  Concerned about mole on chest that is changing shape and color , has scaley spots on face, father has skin cancers     Review Of Systems:  GEN- + fatigue,denies  fever, weight loss,weakness, recent illness HEENT- denies eye drainage, change in vision, nasal discharge, CVS- denies chest pain, palpitations RESP- denies SOB, cough, wheeze ABD- denies N/V, change in stools, abd pain GU- denies dysuria, hematuria, dribbling, incontinence MSK- denies joint pain, muscle aches, injury Neuro- denies headache, dizziness, syncope, seizure activity       Objective:    BP 134/72   Pulse 82   Temp 98.6 F (37 C)  (Oral)   Resp 14   Ht 5\' 11"  (1.803 m)   Wt 236 lb (107 kg)   SpO2 99%   BMI 32.92 kg/m  GEN- NAD, alert and oriented x3 CVS- RRR, no murmur RESP-CTAB Psych-depressed affect, not anxious, no SI, well groomed, normal speech Skin- scaley erythematous lesion bilat side burn region, multiple nevi on back, chest, right chest wall, irregular shaped flat mole with hyperpigmentation near center  EXT- No edema Pulses- Radial, DP- 2+        Assessment & Plan:      Problem List Items Addressed This Visit    Hypertension - Primary    Pressure controlled. His renal function was checked today continue the losartan      Relevant Orders   Comprehensive metabolic panel   CBC with Differential/Platelet   Depression with anxiety    Increased depression and anxiety symptoms being a caregiver. He did not have good side effects with the Zoloft was a try something different. He is still taking the alprazolam up warts of 3 times a day. I'm then increase his alprazolam to 1 mg 3 times a day which is basically how he has been taking a few days a week. I will also start him on Lexapro 10 mg at bedtime. We'll follow-up in 6 weeks. This is a difficult situation for both him and his wife financially and emotionally.  Other Visit Diagnoses    Atypical nevus       Referral to derm, concern for AK on face, atypical mole with changes on Right chest wall      Note: This dictation was prepared with Dragon dictation along with smaller phrase technology. Any transcriptional errors that result from this process are unintentional.

## 2017-01-20 NOTE — Patient Instructions (Signed)
F/U 6 weeks   

## 2017-01-20 NOTE — Assessment & Plan Note (Signed)
Pressure controlled. His renal function was checked today continue the losartan

## 2017-01-21 LAB — COMPREHENSIVE METABOLIC PANEL
ALBUMIN: 4.5 g/dL (ref 3.6–5.1)
ALT: 21 U/L (ref 9–46)
AST: 20 U/L (ref 10–35)
Alkaline Phosphatase: 84 U/L (ref 40–115)
BUN: 11 mg/dL (ref 7–25)
CHLORIDE: 100 mmol/L (ref 98–110)
CO2: 25 mmol/L (ref 20–31)
CREATININE: 0.83 mg/dL (ref 0.70–1.33)
Calcium: 9.1 mg/dL (ref 8.6–10.3)
Glucose, Bld: 75 mg/dL (ref 70–99)
POTASSIUM: 4.2 mmol/L (ref 3.5–5.3)
Sodium: 135 mmol/L (ref 135–146)
TOTAL PROTEIN: 6.9 g/dL (ref 6.1–8.1)
Total Bilirubin: 0.5 mg/dL (ref 0.2–1.2)

## 2017-01-21 LAB — CBC WITH DIFFERENTIAL/PLATELET
BASOS PCT: 1 %
Basophils Absolute: 84 cells/uL (ref 0–200)
Eosinophils Absolute: 504 cells/uL — ABNORMAL HIGH (ref 15–500)
Eosinophils Relative: 6 %
HEMATOCRIT: 50.5 % — AB (ref 38.5–50.0)
Hemoglobin: 17.2 g/dL — ABNORMAL HIGH (ref 13.0–17.0)
LYMPHS PCT: 28 %
Lymphs Abs: 2352 cells/uL (ref 850–3900)
MCH: 33 pg (ref 27.0–33.0)
MCHC: 34.1 g/dL (ref 32.0–36.0)
MCV: 96.7 fL (ref 80.0–100.0)
MONO ABS: 588 {cells}/uL (ref 200–950)
MONOS PCT: 7 %
MPV: 10 fL (ref 7.5–12.5)
NEUTROS ABS: 4872 {cells}/uL (ref 1500–7800)
Neutrophils Relative %: 58 %
PLATELETS: 242 10*3/uL (ref 140–400)
RBC: 5.22 MIL/uL (ref 4.20–5.80)
RDW: 13.3 % (ref 11.0–15.0)
WBC: 8.4 10*3/uL (ref 3.8–10.8)

## 2017-01-23 ENCOUNTER — Encounter: Payer: Self-pay | Admitting: Family Medicine

## 2017-01-25 ENCOUNTER — Other Ambulatory Visit: Payer: Self-pay | Admitting: Family Medicine

## 2017-01-25 DIAGNOSIS — I10 Essential (primary) hypertension: Secondary | ICD-10-CM

## 2017-02-14 ENCOUNTER — Other Ambulatory Visit: Payer: Self-pay | Admitting: *Deleted

## 2017-02-14 MED ORDER — ESCITALOPRAM OXALATE 10 MG PO TABS: 10.0000 mg | ORAL_TABLET | Freq: Every day | ORAL | 3 refills | Status: DC

## 2017-03-03 ENCOUNTER — Ambulatory Visit (INDEPENDENT_AMBULATORY_CARE_PROVIDER_SITE_OTHER): Payer: BLUE CROSS/BLUE SHIELD | Admitting: Family Medicine

## 2017-03-03 ENCOUNTER — Encounter: Payer: Self-pay | Admitting: Family Medicine

## 2017-03-03 VITALS — BP 128/84 | HR 82 | Temp 98.6°F | Resp 14 | Ht 71.0 in | Wt 239.0 lb

## 2017-03-03 DIAGNOSIS — F418 Other specified anxiety disorders: Secondary | ICD-10-CM | POA: Diagnosis not present

## 2017-03-03 DIAGNOSIS — Z125 Encounter for screening for malignant neoplasm of prostate: Secondary | ICD-10-CM | POA: Diagnosis not present

## 2017-03-03 DIAGNOSIS — R800 Isolated proteinuria: Secondary | ICD-10-CM

## 2017-03-03 DIAGNOSIS — R300 Dysuria: Secondary | ICD-10-CM | POA: Diagnosis not present

## 2017-03-03 LAB — URINALYSIS, ROUTINE W REFLEX MICROSCOPIC
BILIRUBIN URINE: NEGATIVE
GLUCOSE, UA: NEGATIVE
HGB URINE DIPSTICK: NEGATIVE
Ketones, ur: NEGATIVE
Leukocytes, UA: NEGATIVE
Nitrite: NEGATIVE
Specific Gravity, Urine: 1.025 (ref 1.001–1.035)
pH: 7 (ref 5.0–8.0)

## 2017-03-03 LAB — URINALYSIS, MICROSCOPIC ONLY
Bacteria, UA: NONE SEEN [HPF]
CASTS: NONE SEEN [LPF]
Crystals: NONE SEEN [HPF]
RBC / HPF: NONE SEEN RBC/HPF (ref <=2)
WBC, UA: NONE SEEN WBC/HPF (ref <=5)
YEAST: NONE SEEN [HPF]

## 2017-03-03 NOTE — Patient Instructions (Addendum)
F/U 4 months for Physical We will call with labs

## 2017-03-03 NOTE — Progress Notes (Signed)
   Subjective:    Patient ID: Dennis Crosby, male    DOB: Jan 11, 1966, 51 y.o.   MRN: [id number]  Patient presents for Follow-up (is not fasting) Here for follow-up, here with his wife. He was seen in the last visit with worsening depression and anxiety. He was started on Lexapro 10 mg in the past and on Zoloft. He was also given alprazolam to use 3 times a day as needed for anxiety which she was also on in the past. Feels like his mood is much improved with the Lexapro. He has not had any difficulty at work he's been doing other things when he comes home even that his sole caregiver for his mother to take his mind off of the stress. He is still having difficulty in general with his wife being caregivers and working full time. He states that his wife is not handling it is well as he is now. His siblings have not been helpful either.  In addition he also mentioned that he's had some pain after urinating this been going on for many months. He denies any blood in his urine denies any burning sensation. He has had a bowel movement or history before and had some blood but this was very rare. He does not feel like he is constipated typically. He denies any dribbling with history. He has noted has been stronger odor wise.    Review Of Systems: per above   GEN- denies fatigue, fever, weight loss,weakness, recent illness HEENT- denies eye drainage, change in vision, nasal discharge, CVS- denies chest pain, palpitations RESP- denies SOB, cough, wheeze ABD- denies N/V, change in stools, abd pain GU- +dysuria, denies hematuria, dribbling, incontinence MSK- denies joint pain, muscle aches, injury Neuro- denies headache, dizziness, syncope, seizure activity       Objective:    BP 128/84   Pulse 82   Temp 98.6 F (37 C) (Oral)   Resp 14   Ht 5\' 11"  (1.803 m)   Wt 239 lb (108.4 kg)   SpO2 98%   BMI 33.33 kg/m  GEN- NAD, alert and oriented x3 CVS- RRR, no murmur RESP-CTAB ABD-NABS,soft,NT,ND, no  CVA tenderness GU- DECLINES Psych- normal affect and mood  Pulses- Radial  2+        Assessment & Plan:      Problem List Items Addressed This Visit    Depression with anxiety - Primary    Continue current regimen of Lexapro and alprazolam.  Regards to urine urinalysis shows some mild proteinuria he did have recent metabolic panel which was normal but he has not been drinking his fluids and is working in the heat. I will recheck his creatinine will also send out for microalbumin and a urine culture. His possible he has some prostatitis or some mild BPH PSA will also be checked. Pending results will treat accordingly       Other Visit Diagnoses    Dysuria       Relevant Orders   Urinalysis, Routine w reflex microscopic (Completed)   Urine culture   Prostate cancer screening       Relevant Orders   PSA   Isolated proteinuria without specific morphologic lesion       Relevant Orders   Basic metabolic panel   Microalbumin / creatinine urine ratio      Note: This dictation was prepared with Dragon dictation along with smaller phrase technology. Any transcriptional errors that result from this process are unintentional.

## 2017-03-03 NOTE — Assessment & Plan Note (Signed)
Continue current regimen of Lexapro and alprazolam.  Regards to urine urinalysis shows some mild proteinuria he did have recent metabolic panel which was normal but he has not been drinking his fluids and is working in the heat. I will recheck his creatinine will also send out for microalbumin and a urine culture. His possible he has some prostatitis or some mild BPH PSA will also be checked. Pending results will treat accordingly

## 2017-03-04 LAB — BASIC METABOLIC PANEL
BUN: 15 mg/dL (ref 7–25)
CHLORIDE: 103 mmol/L (ref 98–110)
CO2: 24 mmol/L (ref 20–31)
Calcium: 9.6 mg/dL (ref 8.6–10.3)
Creat: 0.83 mg/dL (ref 0.70–1.33)
GLUCOSE: 95 mg/dL (ref 70–99)
POTASSIUM: 4.2 mmol/L (ref 3.5–5.3)
Sodium: 140 mmol/L (ref 135–146)

## 2017-03-04 LAB — MICROALBUMIN / CREATININE URINE RATIO
Creatinine, Urine: 131 mg/dL (ref 20–370)
MICROALB UR: 0.9 mg/dL
Microalb Creat Ratio: 7 mcg/mg creat (ref <30)

## 2017-03-04 LAB — URINE CULTURE: Organism ID, Bacteria: NO GROWTH

## 2017-03-04 LAB — PSA: PSA: 0.8 ng/mL (ref <=4.0)

## 2017-03-06 ENCOUNTER — Other Ambulatory Visit: Payer: Self-pay | Admitting: *Deleted

## 2017-03-06 MED ORDER — TAMSULOSIN HCL 0.4 MG PO CAPS: 0.4000 mg | ORAL_CAPSULE | Freq: Every day | ORAL | 3 refills | Status: DC

## 2017-04-07 ENCOUNTER — Other Ambulatory Visit: Payer: Self-pay | Admitting: Family Medicine

## 2017-04-07 MED ORDER — TAMSULOSIN HCL 0.4 MG PO CAPS: 0.4000 mg | ORAL_CAPSULE | Freq: Every day | ORAL | 1 refills | Status: DC

## 2017-04-07 NOTE — Telephone Encounter (Signed)
Medication called/sent to requested pharmacy for 90 days

## 2017-05-08 ENCOUNTER — Other Ambulatory Visit: Payer: Self-pay | Admitting: Family Medicine

## 2017-05-08 NOTE — Telephone Encounter (Signed)
Medication called to pharmacy. 

## 2017-05-08 NOTE — Telephone Encounter (Signed)
Ok to refill??  Last office visit 03/03/2017.  Last refill 01/20/2017, #2 refills.

## 2017-05-08 NOTE — Telephone Encounter (Signed)
ok 

## 2017-07-07 ENCOUNTER — Encounter: Payer: BLUE CROSS/BLUE SHIELD | Admitting: Family Medicine

## 2017-07-07 ENCOUNTER — Ambulatory Visit (INDEPENDENT_AMBULATORY_CARE_PROVIDER_SITE_OTHER): Payer: BLUE CROSS/BLUE SHIELD | Admitting: Family Medicine

## 2017-07-07 ENCOUNTER — Encounter: Payer: Self-pay | Admitting: Family Medicine

## 2017-07-07 VITALS — BP 126/86 | HR 80 | Temp 98.1°F | Resp 18 | Ht 70.0 in | Wt 256.2 lb

## 2017-07-07 DIAGNOSIS — I10 Essential (primary) hypertension: Secondary | ICD-10-CM | POA: Diagnosis not present

## 2017-07-07 DIAGNOSIS — F418 Other specified anxiety disorders: Secondary | ICD-10-CM

## 2017-07-07 DIAGNOSIS — N400 Enlarged prostate without lower urinary tract symptoms: Secondary | ICD-10-CM | POA: Diagnosis not present

## 2017-07-07 DIAGNOSIS — Z0000 Encounter for general adult medical examination without abnormal findings: Secondary | ICD-10-CM | POA: Diagnosis not present

## 2017-07-07 DIAGNOSIS — Z720 Tobacco use: Secondary | ICD-10-CM | POA: Diagnosis not present

## 2017-07-07 DIAGNOSIS — Z23 Encounter for immunization: Secondary | ICD-10-CM | POA: Diagnosis not present

## 2017-07-07 DIAGNOSIS — E669 Obesity, unspecified: Secondary | ICD-10-CM

## 2017-07-07 LAB — COMPREHENSIVE METABOLIC PANEL
AG Ratio: 1.9 (calc) (ref 1.0–2.5)
ALKALINE PHOSPHATASE (APISO): 90 U/L (ref 40–115)
ALT: 25 U/L (ref 9–46)
AST: 19 U/L (ref 10–35)
Albumin: 4.3 g/dL (ref 3.6–5.1)
BUN: 14 mg/dL (ref 7–25)
CHLORIDE: 101 mmol/L (ref 98–110)
CO2: 27 mmol/L (ref 20–32)
CREATININE: 0.76 mg/dL (ref 0.70–1.33)
Calcium: 9 mg/dL (ref 8.6–10.3)
GLOBULIN: 2.3 g/dL (ref 1.9–3.7)
Glucose, Bld: 85 mg/dL (ref 65–99)
Potassium: 4.1 mmol/L (ref 3.5–5.3)
Sodium: 136 mmol/L (ref 135–146)
Total Bilirubin: 0.4 mg/dL (ref 0.2–1.2)
Total Protein: 6.6 g/dL (ref 6.1–8.1)

## 2017-07-07 LAB — CBC WITH DIFFERENTIAL/PLATELET
BASOS ABS: 61 {cells}/uL (ref 0–200)
BASOS PCT: 1 %
EOS ABS: 348 {cells}/uL (ref 15–500)
Eosinophils Relative: 5.7 %
HCT: 47.4 % (ref 38.5–50.0)
Hemoglobin: 16.1 g/dL (ref 13.2–17.1)
Lymphs Abs: 1671 cells/uL (ref 850–3900)
MCH: 32.1 pg (ref 27.0–33.0)
MCHC: 34 g/dL (ref 32.0–36.0)
MCV: 94.6 fL (ref 80.0–100.0)
MPV: 10.2 fL (ref 7.5–12.5)
Monocytes Relative: 8.5 %
NEUTROS PCT: 57.4 %
Neutro Abs: 3501 cells/uL (ref 1500–7800)
PLATELETS: 202 10*3/uL (ref 140–400)
RBC: 5.01 10*6/uL (ref 4.20–5.80)
RDW: 12.2 % (ref 11.0–15.0)
TOTAL LYMPHOCYTE: 27.4 %
WBC: 6.1 10*3/uL (ref 3.8–10.8)
WBCMIX: 519 {cells}/uL (ref 200–950)

## 2017-07-07 LAB — LIPID PANEL
CHOL/HDL RATIO: 6.7 (calc) — AB (ref <5.0)
CHOLESTEROL: 256 mg/dL — AB (ref <200)
HDL: 38 mg/dL — AB (ref >40)
LDL Cholesterol (Calc): 188 mg/dL (calc) — ABNORMAL HIGH
Non-HDL Cholesterol (Calc): 218 mg/dL (calc) — ABNORMAL HIGH (ref <130)
Triglycerides: 158 mg/dL — ABNORMAL HIGH (ref <150)

## 2017-07-07 NOTE — Assessment & Plan Note (Signed)
CONTROLLED on flomax

## 2017-07-07 NOTE — Progress Notes (Signed)
   Subjective:    Patient ID: Dennis Crosby, male    DOB: [birth year], 51 y.o.   MRN: [id number]  Patient presents for Annual Exam (not fasting)   Pt here to CPE, he is still taking xanax, he stopped the lexapro ,felt like it a numbed him, he has been off for at least A month and feels like he is doing okay.    BPH- taking flomax daily which helps    HTN- taking losartan daily    Colonoscopy -  DUE     Immunizations- FLu/TDAP Tobacco- 6-8 CIG/DAY   PSA- normal 4 months ago 0.8   Review Of Systems:  GEN- denies fatigue, fever, weight loss,weakness, recent illness HEENT- denies eye drainage, change in vision, nasal discharge, CVS- denies chest pain, palpitations RESP- denies SOB, cough, wheeze ABD- denies N/V, change in stools, abd pain GU- denies dysuria, hematuria, dribbling, incontinence MSK- denies joint pain, muscle aches, injury Neuro- denies headache, dizziness, syncope, seizure activity       Objective:    BP 126/86 (BP Location: Right Arm, Patient Position: Sitting, Cuff Size: Large)   Pulse 80   Temp 98.1 F (36.7 C) (Oral)   Resp 18   Ht 5\' 10"  (1.778 m)   Wt 256 lb 3.2 oz (116.2 kg)   BMI 36.76 kg/m  GEN- NAD, alert and oriented x3,gained 17lbs HEENT- PERRL, EOMI, non injected sclera, pink conjunctiva, MMM, oropharynx clear Neck- Supple, no thyromegaly CVS- RRR, no murmur RESP-CTAB ABD-NABS,soft,NT,ND Psych- normal affect and mood  EXT- No edema Pulses- Radial, DP- 2+        Assessment & Plan:      Problem List Items Addressed This Visit      Unprioritized   Tobacco use   Obesity (BMI 30-39.9)   Hypertension    Controlled no changes       Relevant Orders   CBC with Differential/Platelet   Comprehensive metabolic panel   Depression with anxiety    See how he does all of medications. He is still on the alprazolam discussed decrease in this use as much as possible try to keep it twice a day      BPH (benign prostatic hyperplasia)   CONTROLLED on flomax       Other Visit Diagnoses    Routine general medical examination at a health care facility    -  Primary   CPE done, discussed healthy eatting, tobacco cessation, need for weight loss. Check Lipid/CMET, PSA recently normal, refer for cologaurd. TDAP/Flu shot   Relevant Orders   CBC with Differential/Platelet   Comprehensive metabolic panel   Lipid panel   Need for Tdap vaccination       Relevant Orders   Tdap vaccine greater than or equal to 7yo IM (Completed)   Need for prophylactic vaccination and inoculation against influenza       Relevant Orders   Flu Vaccine QUAD 6+ mos PF IM (Fluarix Quad PF) (Completed)      Note: This dictation was prepared with Dragon dictation along with smaller phrase technology. Any transcriptional errors that result from this process are unintentional.

## 2017-07-07 NOTE — Assessment & Plan Note (Signed)
Controlled no changes 

## 2017-07-07 NOTE — Patient Instructions (Addendum)
Cologuard testing  FLU/TDAP given F/U 6 month

## 2017-07-07 NOTE — Assessment & Plan Note (Signed)
See how he does all of medications. He is still on the alprazolam discussed decrease in this use as much as possible try to keep it twice a day

## 2017-07-10 ENCOUNTER — Encounter: Payer: Self-pay | Admitting: *Deleted

## 2017-07-17 DIAGNOSIS — Z1211 Encounter for screening for malignant neoplasm of colon: Secondary | ICD-10-CM | POA: Diagnosis not present

## 2017-07-17 DIAGNOSIS — Z1212 Encounter for screening for malignant neoplasm of rectum: Secondary | ICD-10-CM | POA: Diagnosis not present

## 2017-07-17 LAB — COLOGUARD: COLOGUARD: NEGATIVE

## 2017-07-31 ENCOUNTER — Encounter: Payer: Self-pay | Admitting: *Deleted

## 2017-08-18 ENCOUNTER — Ambulatory Visit: Payer: Self-pay | Admitting: Family Medicine

## 2017-08-21 ENCOUNTER — Other Ambulatory Visit: Payer: Self-pay | Admitting: Family Medicine

## 2017-08-21 NOTE — Telephone Encounter (Signed)
Ok to refill 

## 2017-08-21 NOTE — Telephone Encounter (Signed)
ok 

## 2017-08-22 NOTE — Telephone Encounter (Signed)
Medication called to pharmacy. 

## 2017-09-08 ENCOUNTER — Telehealth: Payer: Self-pay | Admitting: *Deleted

## 2017-09-08 DIAGNOSIS — D229 Melanocytic nevi, unspecified: Secondary | ICD-10-CM

## 2017-09-08 NOTE — Telephone Encounter (Signed)
Patient has referral to [facility] for atypical nevous. Patient wife in office and requested referral to be placed to somewhere in [place].

## 2017-09-09 ENCOUNTER — Encounter: Payer: Self-pay | Admitting: Family Medicine

## 2017-09-09 NOTE — Telephone Encounter (Signed)
Per patient wife, patient cancelled appointment at [facility]. The patient is requesting to be referred to somewhere in [place].

## 2017-09-09 NOTE — Telephone Encounter (Signed)
The pt had an appt with  in July!!!  Did he not go?

## 2017-09-09 NOTE — Addendum Note (Signed)
Addended by: Olena Mater on: 09/09/2017 09:50 AM   Modules accepted: Orders

## 2017-10-04 ENCOUNTER — Other Ambulatory Visit: Payer: Self-pay | Admitting: Family Medicine

## 2017-11-02 DIAGNOSIS — H1032 Unspecified acute conjunctivitis, left eye: Secondary | ICD-10-CM | POA: Diagnosis not present

## 2017-11-13 DIAGNOSIS — D0359 Melanoma in situ of other part of trunk: Secondary | ICD-10-CM | POA: Diagnosis not present

## 2017-11-13 DIAGNOSIS — L570 Actinic keratosis: Secondary | ICD-10-CM | POA: Diagnosis not present

## 2017-11-13 DIAGNOSIS — D485 Neoplasm of uncertain behavior of skin: Secondary | ICD-10-CM | POA: Diagnosis not present

## 2017-11-13 DIAGNOSIS — D045 Carcinoma in situ of skin of trunk: Secondary | ICD-10-CM | POA: Diagnosis not present

## 2017-11-13 DIAGNOSIS — L821 Other seborrheic keratosis: Secondary | ICD-10-CM | POA: Diagnosis not present

## 2017-11-13 DIAGNOSIS — D235 Other benign neoplasm of skin of trunk: Secondary | ICD-10-CM | POA: Diagnosis not present

## 2017-11-30 ENCOUNTER — Other Ambulatory Visit: Payer: Self-pay | Admitting: Family Medicine

## 2017-12-01 NOTE — Telephone Encounter (Signed)
Pt is requesting refill on Xanax   LOV: 07/07/17  LRF:   08/22/17

## 2017-12-08 ENCOUNTER — Encounter (HOSPITAL_COMMUNITY): Payer: Self-pay | Admitting: Emergency Medicine

## 2017-12-08 ENCOUNTER — Ambulatory Visit (HOSPITAL_COMMUNITY)
Admission: EM | Admit: 2017-12-08 | Discharge: 2017-12-08 | Disposition: A | Discharge status: Home / Self Care | Payer: BLUE CROSS/BLUE SHIELD | Attending: Family Medicine | Admitting: Family Medicine

## 2017-12-08 ENCOUNTER — Ambulatory Visit (INDEPENDENT_AMBULATORY_CARE_PROVIDER_SITE_OTHER): Payer: BLUE CROSS/BLUE SHIELD

## 2017-12-08 DIAGNOSIS — S99911A Unspecified injury of right ankle, initial encounter: Secondary | ICD-10-CM

## 2017-12-08 DIAGNOSIS — S8251XA Displaced fracture of medial malleolus of right tibia, initial encounter for closed fracture: Secondary | ICD-10-CM | POA: Diagnosis not present

## 2017-12-08 MED ORDER — DICLOFENAC SODIUM 75 MG PO TBEC: 75.0000 mg | DELAYED_RELEASE_TABLET | Freq: Two times a day (BID) | ORAL | 0 refills | Status: AC

## 2017-12-08 MED ORDER — MELOXICAM 7.5 MG PO TABS: 7.5000 mg | ORAL_TABLET | Freq: Every day | ORAL | 0 refills | Status: DC

## 2017-12-08 NOTE — Discharge Instructions (Signed)
X-ray negative for new fracture or dislocation.  Start Mobic for pain and inflammation.  You can take Tylenol for additional pain relief needed. Ice compress, elevation. Cam walker and crutches. Follow up with orthopedics for further evaluation and treatment needed.

## 2017-12-08 NOTE — ED Triage Notes (Signed)
PT stepped in a hole and rolled right ankle today. Area is swollen and bruised

## 2017-12-08 NOTE — ED Provider Notes (Addendum)
[place]    CSN: [id number] Arrival date & time: 12/08/17  [place]     History   Chief Complaint Chief Complaint  Patient presents with  . Ankle Pain    HPI Dennis Crosby is a 52 y.o. male.   52 year old male comes in for right ankle pain after injury today.  States he stepped into a hole, and inverted his ankle.  States he was not able to weight-bear immediately after injury.  He has had significant swelling to the lateral ankle since injury.  Has been applying ice compress without relief.  Has intermittent numbness and tingling of the toes.  Unable to move ankle.      Past Medical History:  Diagnosis Date  . Hypertension     Patient Active Problem List   Diagnosis Date Noted  . BPH (benign prostatic hyperplasia) 07/07/2017  . Obesity (BMI 30-39.9) 07/07/2017  . Tobacco use 07/07/2017  . Hypertension 01/20/2017  . Depression with anxiety 06/07/2016  . Low back pain 07/05/2015    History reviewed. No pertinent surgical history.     Home Medications    Prior to Admission medications   Medication Sig Start Date End Date Taking? Authorizing Provider  ALPRAZolam Duanne Moron) 1 MG tablet TAKE 1 TABLET BY MOUTH THREE TIMES A DAY 12/01/17  Yes Abrams, Modena Nunnery, MD  ibuprofen (ADVIL,MOTRIN) 600 MG tablet Take 1 tablet (600 mg total) by mouth every 8 (eight) hours as needed. 01/30/16  Yes [place], Modena Nunnery, MD  losartan (COZAAR) 50 MG tablet TAKE 1 TABLET (50 MG TOTAL) BY MOUTH DAILY. 01/27/17  Yes Susy Frizzle, MD  tamsulosin (FLOMAX) 0.4 MG CAPS capsule TAKE 1 CAPSULE BY MOUTH EVERY DAY 10/06/17  Yes [place], Modena Nunnery, MD  diclofenac (VOLTAREN) 75 MG EC tablet Take 1 tablet (75 mg total) by mouth 2 (two) times daily for 15 days. 12/08/17 12/23/17  Ok Edwards, PA-C  vitamin B-12 (CYANOCOBALAMIN) 100 MCG tablet Take 100 mcg by mouth daily.    [provider]    Family History Family History  Problem Relation Age of Onset  . Dementia Mother   .  Peripheral vascular disease Father   . Heart disease Father   . Mental illness Sister   . Seizures Brother   . Asthma Brother   . Cancer Paternal Grandmother        bone cancer    Social History Social History   Tobacco Use  . Smoking status: Current Every Day Smoker  . Smokeless tobacco: Never Used  Substance Use Topics  . Alcohol use: Yes    Comment: socially  . Drug use: No     Allergies   Patient has no known allergies.   Review of Systems Review of Systems  Reason unable to perform ROS: See HPI as above.     Physical Exam Triage Vital Signs ED Triage Vitals [12/08/17 1727]  Enc Vitals Group     BP (!) 148/92     Pulse Rate 89     Resp 16     Temp 98.4 F (36.9 C)     Temp Source Oral     SpO2 96 %     Weight 240 lb (108.9 kg)     Height      Head Circumference      Peak Flow      Pain Score 7     Pain Loc      Pain Edu?      Excl.  in [place]?    No data found.  Updated Vital Signs BP (!) 148/92   Pulse 89   Temp 98.4 F (36.9 C) (Oral)   Resp 16   Wt 240 lb (108.9 kg)   SpO2 96%   BMI 34.44 kg/m   Physical Exam  Constitutional: He is oriented to person, place, and time. He appears well-developed and well-nourished. No distress.  HENT:  Head: Normocephalic and atraumatic.  Eyes: Conjunctivae are normal. Pupils are equal, round, and reactive to light.  Musculoskeletal:  Significant swelling to the right lateral malleolus without contusion, erythema, increased warmth.  Tenderness to palpation of lateral malleolus, proximal fifth MTP.  No tenderness on palpation of dorsal aspect of foot, medial malleolus.  Decreased range of motion of ankle.  Strength deferred.  Sensation intact and equal bilaterally.  Pedal pulses 2+ and equal bilaterally.  Cap refill less than 2 seconds.  Neurological: He is alert and oriented to person, place, and time.     UC Treatments / Results  Labs (all labs ordered are listed, but only abnormal results are  displayed) Labs Reviewed - No data to display  EKG  EKG Interpretation None       Radiology Dg Ankle Complete Right  Result Date: 12/08/2017 CLINICAL DATA:  Fall, ankle pain. EXAM: RIGHT ANKLE - COMPLETE 3+ VIEW COMPARISON:  None. FINDINGS: Prominent soft tissue swelling overlying the lateral malleolus. Small calcific fragments underlying the medial malleolus, of uncertain age but favored to be chronic. Ankle mortise is symmetric. Visualized portions of the hindfoot and midfoot appear intact and normally aligned. IMPRESSION: 1. Small avulsion fracture fragments underlying the medial malleolus, of uncertain age but favored to be chronic. Osseous structures of the right ankle are otherwise intact and normally aligned. 2. Prominent soft tissue swelling overlying the lateral malleolus. Electronically Signed   By: Franki Cabot M.D.   On: 12/08/2017 17:51    Procedures Procedures (including critical care time)  Medications Ordered in UC Medications - No data to display   Initial Impression / Assessment and Plan / UC Course  I have reviewed the triage vital signs and the nursing notes.  Pertinent labs & imaging results that were available during my care of the patient were reviewed by me and considered in my medical decision making (see chart for details).    X-ray negative for acute fracture or dislocation.  There is show possible chronic avulsion fracture of the medial malleolus.  Patient without tenderness to palpation of medial malleolus.  Discussed possible ligament injury.  NSAIDs for pain and inflammation.  Ice compress, elevation, Cam walker and crutches during activity.  Patient to follow-up with orthopedics for further evaluation and treatment needed.  Return precautions given.  Patient expresses understanding and agrees to plan.  Final Clinical Impressions(s) / UC Diagnoses   Final diagnoses:  Injury of right ankle, initial encounter    ED Discharge Orders        Ordered     meloxicam (MOBIC) 7.5 MG tablet  Daily,   Status:  Discontinued     12/08/17 1822    diclofenac (VOLTAREN) 75 MG EC tablet  2 times daily     12/08/17 1831        Arturo Morton 12/08/17 1831    Ok Edwards, PA-C 12/08/17 1832

## 2017-12-12 DIAGNOSIS — S93401A Sprain of unspecified ligament of right ankle, initial encounter: Secondary | ICD-10-CM | POA: Diagnosis not present

## 2017-12-12 DIAGNOSIS — M25571 Pain in right ankle and joints of right foot: Secondary | ICD-10-CM | POA: Diagnosis not present

## 2017-12-16 DIAGNOSIS — D0359 Melanoma in situ of other part of trunk: Secondary | ICD-10-CM | POA: Diagnosis not present

## 2017-12-16 DIAGNOSIS — D045 Carcinoma in situ of skin of trunk: Secondary | ICD-10-CM | POA: Diagnosis not present

## 2017-12-16 DIAGNOSIS — L570 Actinic keratosis: Secondary | ICD-10-CM | POA: Diagnosis not present

## 2017-12-26 DIAGNOSIS — M25571 Pain in right ankle and joints of right foot: Secondary | ICD-10-CM | POA: Diagnosis not present

## 2017-12-30 DIAGNOSIS — M25571 Pain in right ankle and joints of right foot: Secondary | ICD-10-CM | POA: Diagnosis not present

## 2018-01-02 DIAGNOSIS — S8251XD Displaced fracture of medial malleolus of right tibia, subsequent encounter for closed fracture with routine healing: Secondary | ICD-10-CM | POA: Diagnosis not present

## 2018-01-06 DIAGNOSIS — M25571 Pain in right ankle and joints of right foot: Secondary | ICD-10-CM | POA: Diagnosis not present

## 2018-01-12 DIAGNOSIS — M25571 Pain in right ankle and joints of right foot: Secondary | ICD-10-CM | POA: Diagnosis not present

## 2018-01-14 ENCOUNTER — Other Ambulatory Visit: Payer: Self-pay | Admitting: Family Medicine

## 2018-01-14 DIAGNOSIS — I10 Essential (primary) hypertension: Secondary | ICD-10-CM

## 2018-01-19 DIAGNOSIS — M25571 Pain in right ankle and joints of right foot: Secondary | ICD-10-CM | POA: Diagnosis not present

## 2018-01-28 DIAGNOSIS — S93401A Sprain of unspecified ligament of right ankle, initial encounter: Secondary | ICD-10-CM | POA: Diagnosis not present

## 2018-01-28 DIAGNOSIS — S8251XD Displaced fracture of medial malleolus of right tibia, subsequent encounter for closed fracture with routine healing: Secondary | ICD-10-CM | POA: Diagnosis not present

## 2018-02-25 ENCOUNTER — Encounter: Payer: Self-pay | Admitting: Family Medicine

## 2018-02-27 ENCOUNTER — Other Ambulatory Visit: Payer: Self-pay | Admitting: Family Medicine

## 2018-03-02 NOTE — Telephone Encounter (Signed)
ntbs

## 2018-03-02 NOTE — Telephone Encounter (Signed)
Ok to refill??  Last office visit 07/07/2017.  Last refill 12/01/2017, #2 refills.

## 2018-03-03 ENCOUNTER — Encounter: Payer: Self-pay | Admitting: *Deleted

## 2018-03-03 ENCOUNTER — Encounter: Payer: Self-pay | Admitting: Family Medicine

## 2018-03-03 ENCOUNTER — Other Ambulatory Visit: Payer: Self-pay

## 2018-03-03 ENCOUNTER — Ambulatory Visit: Payer: BLUE CROSS/BLUE SHIELD | Admitting: Family Medicine

## 2018-03-03 VITALS — BP 130/82 | HR 60 | Temp 97.8°F | Resp 14 | Ht 70.0 in | Wt 244.0 lb

## 2018-03-03 DIAGNOSIS — K5900 Constipation, unspecified: Secondary | ICD-10-CM

## 2018-03-03 DIAGNOSIS — K625 Hemorrhage of anus and rectum: Secondary | ICD-10-CM

## 2018-03-03 DIAGNOSIS — M545 Low back pain, unspecified: Secondary | ICD-10-CM

## 2018-03-03 DIAGNOSIS — G8929 Other chronic pain: Secondary | ICD-10-CM | POA: Diagnosis not present

## 2018-03-03 MED ORDER — CYCLOBENZAPRINE HCL 10 MG PO TABS: 10.0000 mg | ORAL_TABLET | Freq: Three times a day (TID) | ORAL | 1 refills | Status: DC | PRN

## 2018-03-03 MED ORDER — NAPROXEN 500 MG PO TABS: 500.0000 mg | ORAL_TABLET | Freq: Two times a day (BID) | ORAL | 1 refills | Status: DC | PRN

## 2018-03-03 NOTE — Progress Notes (Signed)
   Subjective:    Patient ID: Dennis Crosby, male    DOB: [birth year], 52 y.o.   MRN: [id number]  Patient presents for Blood in Stool (intermittent bright red blood noted after hard stools) and Lower Back Pain (R sided lower back pain)  Pt here with blood in stool after straining with BM last week, had 3 BM with bright red blood from the rectum, had been constipated. He stopped drinking coffee  Had negative Cologuard in 2018  Right sided back pain- chronic back pain on and off, has been elevated a few times for right sided pain in the past.  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           Review Of Systems:  GEN- denies fatigue, fever, weight loss,weakness, recent illness HEENT- denies eye drainage, change in vision, nasal discharge, CVS- denies chest pain, palpitations RESP- denies SOB, cough, wheeze ABD- denies N/V, change in stools, abd pain GU- denies dysuria, hematuria, dribbling, incontinence MSK- + joint pain, muscle aches, injury Neuro- denies headache, dizziness, syncope, seizure activity       Objective:    BP 130/82   Pulse 60   Temp 97.8 F (36.6 C) (Oral)   Resp 14   Ht 5\' 10"  (1.778 m)   Wt 244 lb (110.7 kg)   SpO2 96%   BMI 35.01 kg/m  GEN- NAD, alert and oriented x3 CVS- RRR, no murmur RESP-CTAB ABD-NABS,soft,NT,ND MSK- Spine NT, good ROM, no spasm, neg SLR RECTUM- normal tone, no external tags/lesions, FOBT negative EXT- No edema Pulses- Radial  2+        Assessment & Plan:      Problem List Items Addressed This Visit      Unprioritized   Low back pain    Chronic remittent pain.  We will give him Flexeril and Naprosyn to have on hand.  His exam is benign today.  If this worsens despite medications we will then obtain imaging.      Relevant Medications   cyclobenzaprine (FLEXERIL) 10 MG tablet   naproxen (NAPROSYN) 500 MG tablet     Other Visit Diagnoses    Rectal bleeding    -  Primary   Constipation, unspecified constipation type       Discussed stool softeners increasing fiber.  Rectal bleeding likely from small tear or internal hemorrhoid he did had negative Cologuard Bleeding resolved      Note: This dictation was prepared with Dragon dictation along with smaller phrase technology. Any transcriptional errors that result from this process are unintentional.

## 2018-03-03 NOTE — Telephone Encounter (Signed)
Letter sent.

## 2018-03-03 NOTE — Patient Instructions (Addendum)
Use fiber, more water You can also use stool softner  Call if back gets worse  F/U as previous

## 2018-03-04 ENCOUNTER — Encounter: Payer: Self-pay | Admitting: Family Medicine

## 2018-03-04 NOTE — Assessment & Plan Note (Signed)
Chronic remittent pain.  We will give him Flexeril and Naprosyn to have on hand.  His exam is benign today.  If this worsens despite medications we will then obtain imaging.

## 2018-03-29 ENCOUNTER — Other Ambulatory Visit: Payer: Self-pay | Admitting: Family Medicine

## 2018-04-13 ENCOUNTER — Other Ambulatory Visit: Payer: Self-pay | Admitting: Family Medicine

## 2018-04-13 NOTE — Telephone Encounter (Signed)
Ok to refill??  Last office visit 03/03/2018.  Last refill 03/02/2018.

## 2018-04-25 ENCOUNTER — Other Ambulatory Visit: Payer: Self-pay | Admitting: Family Medicine

## 2018-05-28 ENCOUNTER — Other Ambulatory Visit: Payer: Self-pay | Admitting: Family Medicine

## 2018-06-02 IMAGING — DX DG ANKLE COMPLETE 3+V*R*
3 series · 3 of 3 positions shown · non-contrast
Comparison: None.

CLINICAL DATA: Fall, ankle pain.

EXAM:
RIGHT ANKLE - COMPLETE 3+ VIEW

[ankle ap]
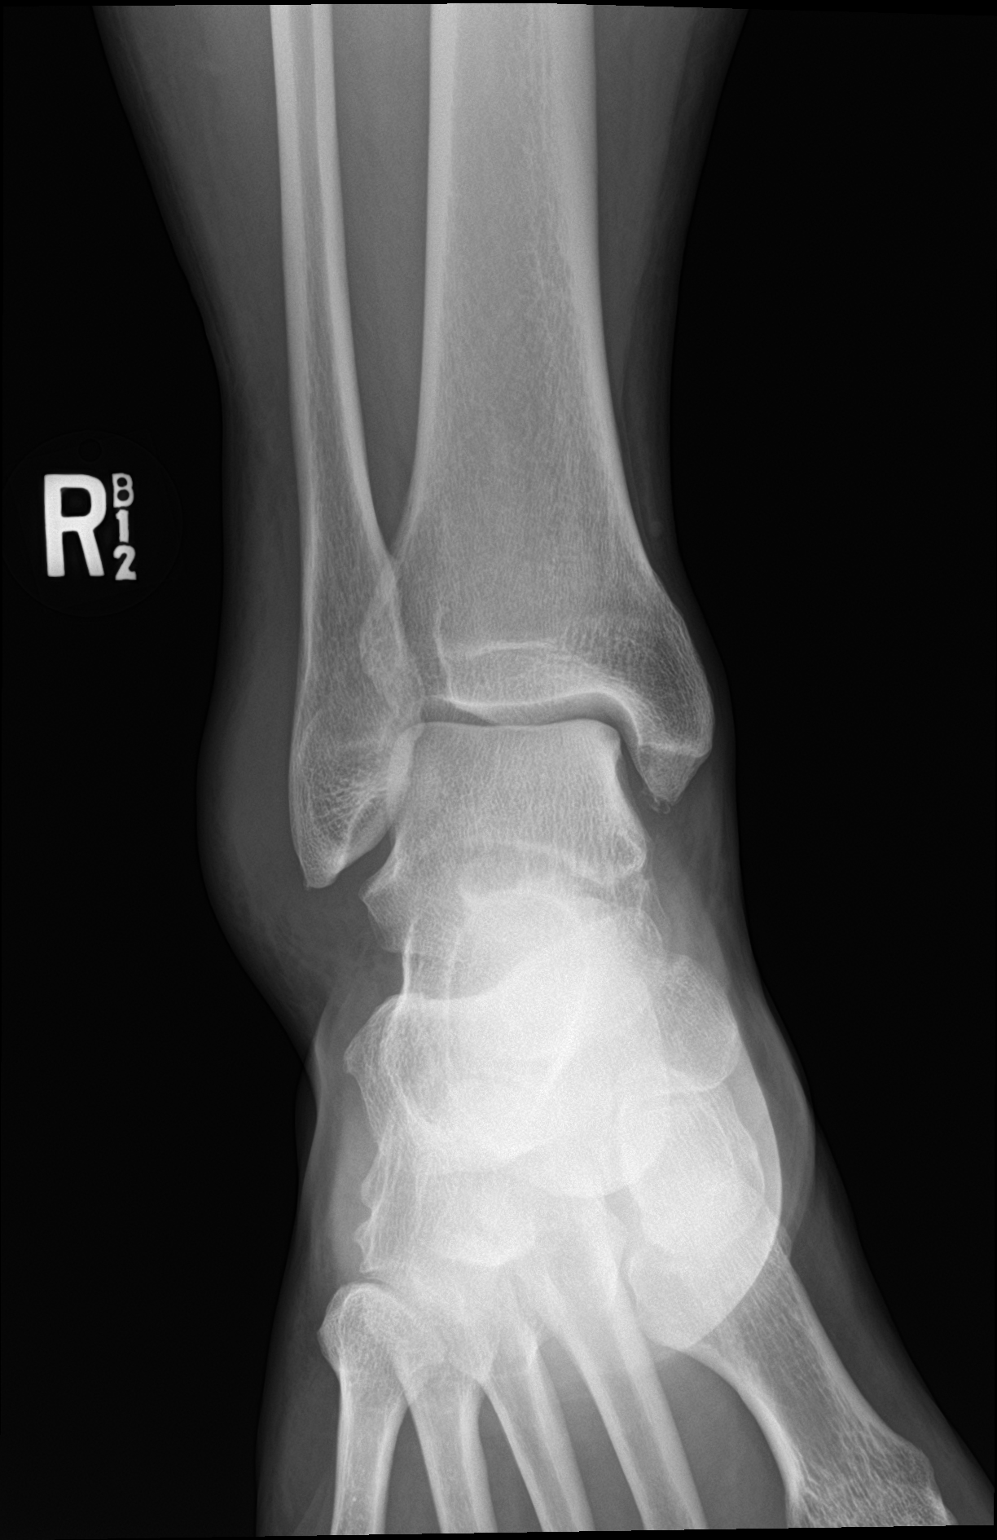

[ankle obl]
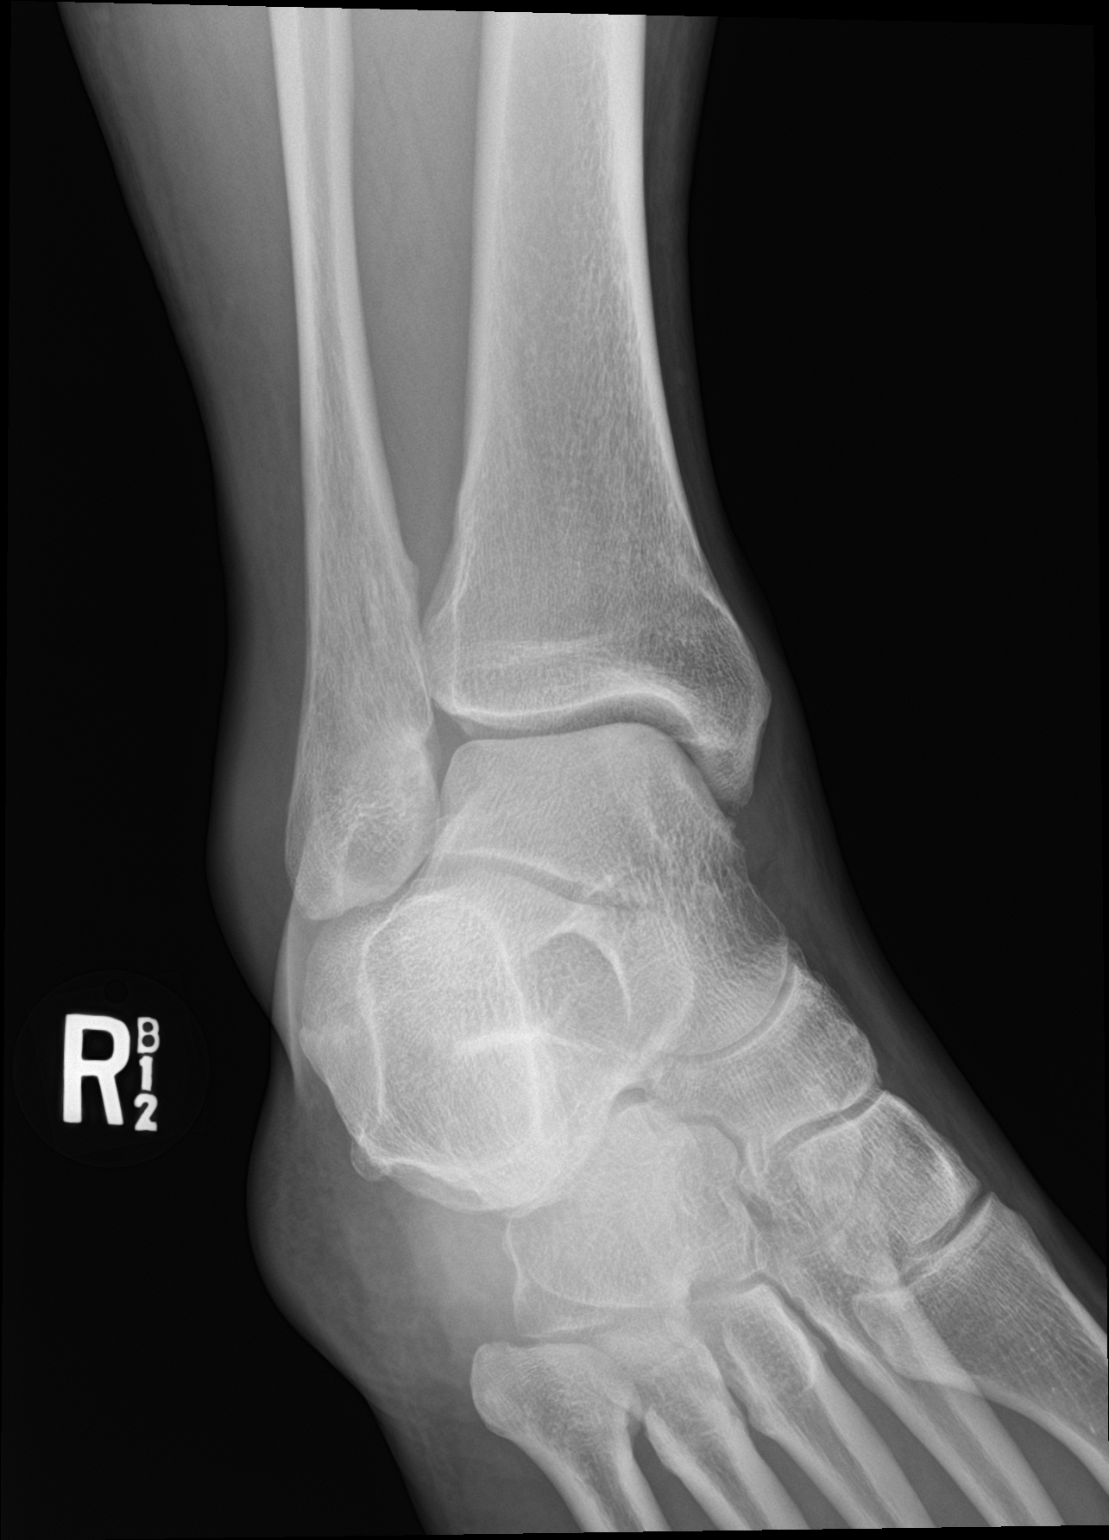

[ankle lat]
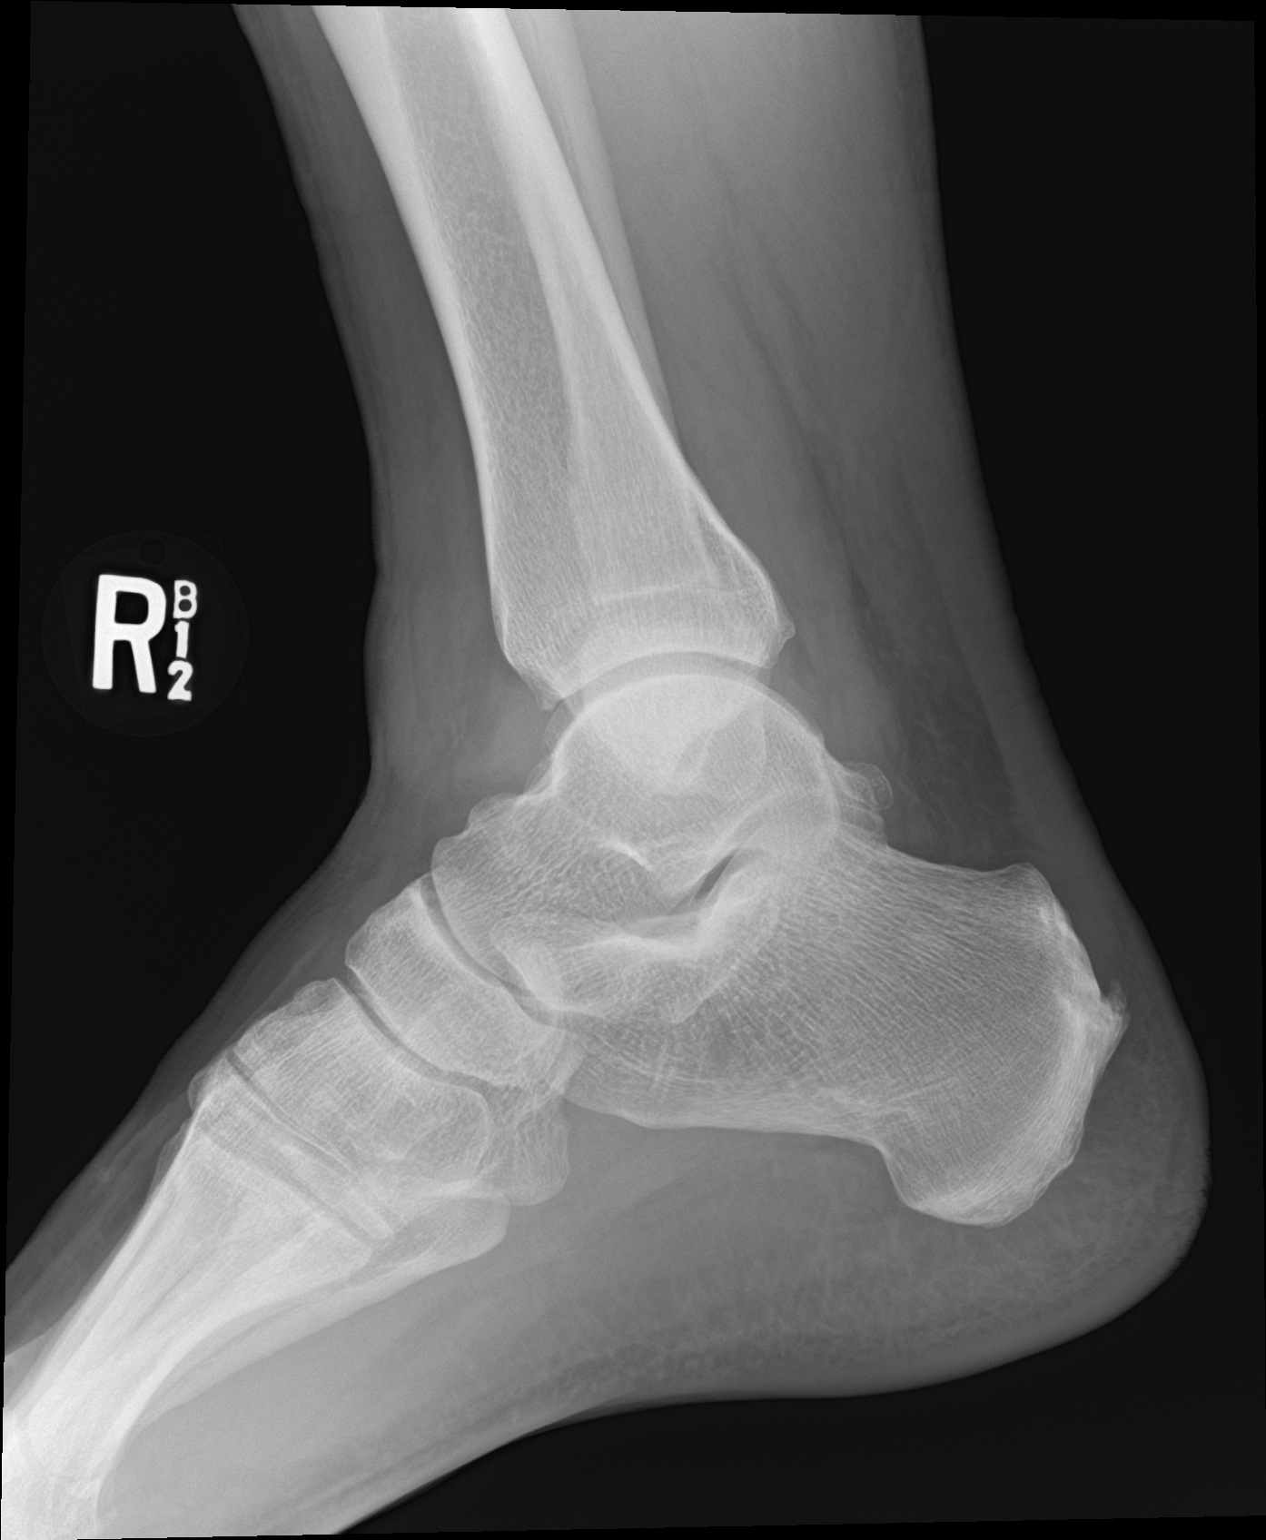

[3 of 3 positions shown; findings below may reference images not displayed]

FINDINGS: Prominent soft tissue swelling overlying the lateral malleolus.
Small calcific fragments underlying the medial malleolus, of
uncertain age but favored to be chronic. Ankle mortise is symmetric.
Visualized portions of the hindfoot and midfoot appear intact and
normally aligned.
IMPRESSION: 1. Small avulsion fracture fragments underlying the medial
malleolus, of uncertain age but favored to be chronic. Osseous
structures of the right ankle are otherwise intact and normally
aligned.
2. Prominent soft tissue swelling overlying the lateral malleolus.

## 2018-06-05 ENCOUNTER — Ambulatory Visit: Payer: BLUE CROSS/BLUE SHIELD | Admitting: Family Medicine

## 2018-06-05 ENCOUNTER — Encounter: Payer: Self-pay | Admitting: Family Medicine

## 2018-06-05 VITALS — BP 114/74 | HR 79 | Temp 98.1°F | Resp 16 | Ht 70.0 in | Wt 246.2 lb

## 2018-06-05 DIAGNOSIS — E669 Obesity, unspecified: Secondary | ICD-10-CM | POA: Diagnosis not present

## 2018-06-05 DIAGNOSIS — E782 Mixed hyperlipidemia: Secondary | ICD-10-CM | POA: Diagnosis not present

## 2018-06-05 DIAGNOSIS — I10 Essential (primary) hypertension: Secondary | ICD-10-CM | POA: Diagnosis not present

## 2018-06-05 DIAGNOSIS — E785 Hyperlipidemia, unspecified: Secondary | ICD-10-CM | POA: Insufficient documentation

## 2018-06-05 DIAGNOSIS — F418 Other specified anxiety disorders: Secondary | ICD-10-CM

## 2018-06-05 DIAGNOSIS — Z720 Tobacco use: Secondary | ICD-10-CM | POA: Diagnosis not present

## 2018-06-05 DIAGNOSIS — N400 Enlarged prostate without lower urinary tract symptoms: Secondary | ICD-10-CM

## 2018-06-05 DIAGNOSIS — Z125 Encounter for screening for malignant neoplasm of prostate: Secondary | ICD-10-CM

## 2018-06-05 NOTE — Progress Notes (Signed)
   Subjective:    Patient ID: Dennis Crosby, male    DOB: 02/08/1966, 52 y.o.   MRN: [id number]  Patient presents for Hypertension  Pt here to f/u chronic medical problems   Cutting down on soda, trying to take lunch    Started back smoking again in June after a stressful weekend    GAD- continues to deal with stress of taking care of mother with dementia , uses xanax, wife cares for mother most of day , no help from other siblings Typically takes 1-2 a day , sleeping good   Due for cholesterol recheck    Review Of Systems:  GEN- denies fatigue, fever, weight loss,weakness, recent illness HEENT- denies eye drainage, change in vision, nasal discharge, CVS- denies chest pain, palpitations RESP- denies SOB, cough, wheeze ABD- denies N/V, change in stools, abd pain GU- denies dysuria, hematuria, dribbling, incontinence MSK- denies joint pain, muscle aches, injury Neuro- denies headache, dizziness, syncope, seizure activity       Objective:    BP 114/74   Pulse 79   Temp 98.1 F (36.7 C) (Oral)   Resp 16   Ht 5\' 10"  (1.778 m)   Wt 246 lb 4 oz (111.7 kg)   SpO2 95%   BMI 35.33 kg/m  GEN- NAD, alert and oriented x3,obese HEENT- PERRL, EOMI, non injected sclera, pink conjunctiva, MMM, oropharynx clear Neck- Supple, no thyromegaly CVS- RRR, no murmur RESP-CTAB ABD-NABS,soft,NT,ND Psych- normal affect and  mood EXT- No edema Pulses- Radial, DP- 2+    PHQ 9 score  7    Assessment & Plan:      Problem List Items Addressed This Visit      Unprioritized   BPH (benign prostatic hyperplasia)    Continue flomax      Depression with anxiety    Continue xanax, overall doing well      Hyperlipidemia    Return for fasting labs Discussed diet, eating lots of nabs/carbs, need for weight loss       Hypertension - Primary    Controlled no changes to meds      Obesity (BMI 30-39.9)    Goal 25lbs off      Tobacco use    Wants to quit cold [place] like before, he  already cutting back         Note: This dictation was prepared with Dragon dictation along with smaller phrase technology. Any transcriptional errors that result from this process are unintentional.

## 2018-06-05 NOTE — Patient Instructions (Addendum)
Schedule a lab visit for next week  F/U Schedule physical due December

## 2018-06-06 ENCOUNTER — Encounter: Payer: Self-pay | Admitting: Family Medicine

## 2018-06-06 NOTE — Assessment & Plan Note (Signed)
Controlled no changes to meds 

## 2018-06-06 NOTE — Assessment & Plan Note (Signed)
Continue xanax, overall doing well

## 2018-06-06 NOTE — Assessment & Plan Note (Signed)
Continue flomax  

## 2018-06-06 NOTE — Assessment & Plan Note (Signed)
Goal 25lbs off

## 2018-06-06 NOTE — Assessment & Plan Note (Signed)
Wants to quit cold [place] like before, he already cutting back

## 2018-06-06 NOTE — Assessment & Plan Note (Signed)
Return for fasting labs Discussed diet, eating lots of nabs/carbs, need for weight loss

## 2018-07-19 ENCOUNTER — Other Ambulatory Visit: Payer: Self-pay | Admitting: Family Medicine

## 2018-07-20 NOTE — Telephone Encounter (Signed)
Ok to refill??  Last office visit 06/05/2018.  Last refill 04/13/2018, #2 refills.

## 2018-07-21 ENCOUNTER — Other Ambulatory Visit: Payer: Self-pay | Admitting: *Deleted

## 2018-07-21 DIAGNOSIS — I10 Essential (primary) hypertension: Secondary | ICD-10-CM

## 2018-07-21 MED ORDER — LOSARTAN POTASSIUM 25 MG PO TABS: 50.0000 mg | ORAL_TABLET | Freq: Every day | ORAL | 3 refills | Status: DC

## 2018-08-17 DIAGNOSIS — D225 Melanocytic nevi of trunk: Secondary | ICD-10-CM | POA: Diagnosis not present

## 2018-08-17 DIAGNOSIS — L570 Actinic keratosis: Secondary | ICD-10-CM | POA: Diagnosis not present

## 2018-08-17 DIAGNOSIS — D485 Neoplasm of uncertain behavior of skin: Secondary | ICD-10-CM | POA: Diagnosis not present

## 2018-08-17 DIAGNOSIS — Z85820 Personal history of malignant melanoma of skin: Secondary | ICD-10-CM | POA: Diagnosis not present

## 2018-08-17 DIAGNOSIS — L820 Inflamed seborrheic keratosis: Secondary | ICD-10-CM | POA: Diagnosis not present

## 2018-09-23 ENCOUNTER — Encounter: Payer: Self-pay | Admitting: Family Medicine

## 2018-09-23 ENCOUNTER — Ambulatory Visit: Payer: BLUE CROSS/BLUE SHIELD | Admitting: Family Medicine

## 2018-09-23 ENCOUNTER — Other Ambulatory Visit: Payer: Self-pay

## 2018-09-23 VITALS — BP 116/70 | HR 72 | Temp 98.1°F | Resp 14 | Ht 70.0 in | Wt 235.0 lb

## 2018-09-23 DIAGNOSIS — F331 Major depressive disorder, recurrent, moderate: Secondary | ICD-10-CM | POA: Diagnosis not present

## 2018-09-23 DIAGNOSIS — F411 Generalized anxiety disorder: Secondary | ICD-10-CM

## 2018-09-23 MED ORDER — ESCITALOPRAM OXALATE 10 MG PO TABS: 10.0000 mg | ORAL_TABLET | Freq: Every day | ORAL | 3 refills | Status: DC

## 2018-09-23 NOTE — Progress Notes (Signed)
   Subjective:    Patient ID: Dennis Crosby, male    DOB: July 16, 1966, 52 y.o.   MRN: [id number]  Patient presents for Depression (x months)  Pt here to discuss his depression, this has worsened the past few months. He is overwhelmed with caring for his ailing mother, also has grandchildren in home, he and wife work opposite shifts. He does not sleep well, always worried about things, he is not eating as much, has lost weight from stress. Taking xanax three times a day. OFten does not want to get up for work or leave the house. This has also affected relationship with wife  Doing well at work   Review Of Systems:  GEN- denies fatigue, fever, weight loss,weakness, recent illness HEENT- denies eye drainage, change in vision, nasal discharge, CVS- denies chest pain, palpitations RESP- denies SOB, cough, wheeze ABD- denies N/V, change in stools, abd pain GU- denies dysuria, hematuria, dribbling, incontinence MSK- denies joint pain, muscle aches, injury Neuro- denies headache, dizziness, syncope, seizure activity       Objective:    BP 116/70   Pulse 72   Temp 98.1 F (36.7 C) (Oral)   Resp 14   Ht 5\' 10"  (1.778 m)   Wt 235 lb (106.6 kg)   SpO2 96%   BMI 33.72 kg/m  GEN- NAD, alert and oriented x3 Psych- depressed affect, not anxious, no SI, well groomed, good eye contact        Assessment & Plan:      Problem List Items Addressed This Visit      Unprioritized   GAD (generalized anxiety disorder)   Relevant Medications   escitalopram (LEXAPRO) 10 MG tablet   MDD (major depressive disorder) - Primary    Very high scores for both PHQ9 and the GAD 7, long standing history of depression with anxiety, did well on lexapro but stopped omn his own a couple of years ago. Had SE with zoloft Restart lexapro 10mg , continue xanax, recheck in 4-6 weeks Discussed SE of the medications and use   unfortunatley can not change the caregiver status      Relevant Medications   escitalopram (LEXAPRO) 10 MG tablet      Note: This dictation was prepared with Dragon dictation along with smaller phrase technology. Any transcriptional errors that result from this process are unintentional.

## 2018-09-23 NOTE — Patient Instructions (Signed)
F/u 6 WEEKS FOR MEDICATION Start lexapro at bedtime

## 2018-09-23 NOTE — Assessment & Plan Note (Addendum)
Very high scores for both PHQ9 and the GAD 7, long standing history of depression with anxiety, did well on lexapro but stopped omn his own a couple of years ago. Had SE with zoloft Restart lexapro 10mg , continue xanax, recheck in 4-6 weeks Discussed SE of the medications and use   unfortunatley can not change the caregiver status

## 2018-09-24 ENCOUNTER — Other Ambulatory Visit: Payer: Self-pay | Admitting: Family Medicine

## 2018-10-09 ENCOUNTER — Other Ambulatory Visit: Payer: BLUE CROSS/BLUE SHIELD

## 2018-10-13 ENCOUNTER — Encounter: Payer: Self-pay | Admitting: Family Medicine

## 2018-10-13 ENCOUNTER — Encounter: Payer: BLUE CROSS/BLUE SHIELD | Admitting: Family Medicine

## 2018-10-13 ENCOUNTER — Ambulatory Visit (INDEPENDENT_AMBULATORY_CARE_PROVIDER_SITE_OTHER): Payer: BLUE CROSS/BLUE SHIELD | Admitting: Family Medicine

## 2018-10-13 ENCOUNTER — Other Ambulatory Visit: Payer: Self-pay

## 2018-10-13 VITALS — BP 116/62 | HR 82 | Temp 98.2°F | Resp 14 | Ht 70.0 in | Wt 235.0 lb

## 2018-10-13 DIAGNOSIS — Z0000 Encounter for general adult medical examination without abnormal findings: Secondary | ICD-10-CM | POA: Diagnosis not present

## 2018-10-13 DIAGNOSIS — E669 Obesity, unspecified: Secondary | ICD-10-CM | POA: Diagnosis not present

## 2018-10-13 DIAGNOSIS — I10 Essential (primary) hypertension: Secondary | ICD-10-CM

## 2018-10-13 DIAGNOSIS — Z125 Encounter for screening for malignant neoplasm of prostate: Secondary | ICD-10-CM | POA: Diagnosis not present

## 2018-10-13 DIAGNOSIS — E782 Mixed hyperlipidemia: Secondary | ICD-10-CM | POA: Diagnosis not present

## 2018-10-13 DIAGNOSIS — F331 Major depressive disorder, recurrent, moderate: Secondary | ICD-10-CM | POA: Diagnosis not present

## 2018-10-13 DIAGNOSIS — Z23 Encounter for immunization: Secondary | ICD-10-CM

## 2018-10-13 DIAGNOSIS — Z114 Encounter for screening for human immunodeficiency virus [HIV]: Secondary | ICD-10-CM

## 2018-10-13 DIAGNOSIS — F411 Generalized anxiety disorder: Secondary | ICD-10-CM

## 2018-10-13 LAB — PSA: PSA: 0.9 ng/mL (ref <=4.0)

## 2018-10-13 NOTE — Assessment & Plan Note (Signed)
Well controlled no changes Fasting labs today

## 2018-10-13 NOTE — Assessment & Plan Note (Signed)
Mood already improving with lexapro and xanax No change to dose, reassess in a few  months

## 2018-10-13 NOTE — Progress Notes (Signed)
   Subjective:    Patient ID: Dennis Crosby, male    DOB: 12/06/1965, 52 y.o.   MRN: [id number]  Patient presents for Annual Exam (is fasting)  Pt here for CPE     He was last here 2 weeks ago, and started Lexapro for anxiety and depression. Also on xanax as needed , Feels lexapro has helped and feels he it has helped a lot . He has been able to handle his mother much easier with the medication on board, does not feel as overwhelmed   Cologuard negative in 2018   Immunizations- Due for flu shot     PSA- due  For screening    HIV due for screening         Review Of Systems:  GEN- denies fatigue, fever, weight loss,weakness, recent illness HEENT- denies eye drainage, change in vision, nasal discharge, CVS- denies chest pain, palpitations RESP- denies SOB, cough, wheeze ABD- denies N/V, change in stools, abd pain GU- denies dysuria, hematuria, dribbling, incontinence MSK- denies joint pain, muscle aches, injury Neuro- denies headache, dizziness, syncope, seizure activity       Objective:    BP 116/62   Pulse 82   Temp 98.2 F (36.8 C) (Oral)   Resp 14   Ht 5\' 10"  (1.778 m)   Wt 235 lb (106.6 kg)   SpO2 96%   BMI 33.72 kg/m  GEN- NAD, alert and oriented x3 HEENT- PERRL, EOMI, non injected sclera, pink conjunctiva, MMM, oropharynx clear Neck- Supple, no thyromegaly CVS- RRR, no murmur RESP-CTAB ABD-NABS,soft,NT,ND EXT- No edema Pulses- Radial, DP- 2+        Assessment & Plan:      Problem List Items Addressed This Visit      Unprioritized   GAD (generalized anxiety disorder)   Hyperlipidemia   Relevant Orders   Lipid panel   Hypertension    Well controlled no changes Fasting labs today      MDD (major depressive disorder)    Mood already improving with lexapro and xanax No change to dose, reassess in a few  months      Obesity (BMI 30-39.9)    Other Visit Diagnoses    Routine general medical examination at a health care facility    -  Primary    CPE done, HIV and PSA screening, Flu shot given   Relevant Orders   CBC with Differential/Platelet   Comprehensive metabolic panel   Encounter for screening for HIV       Relevant Orders   HIV Antibody (routine testing w rflx)   Prostate cancer screening       Relevant Orders   PSA   Need for immunization against influenza       Relevant Orders   Flu Vaccine QUAD 36+ mos IM (Completed)      Note: This dictation was prepared with Dragon dictation along with smaller phrase technology. Any transcriptional errors that result from this process are unintentional.

## 2018-10-13 NOTE — Patient Instructions (Signed)
F/U 3 months  Continue current medications

## 2018-10-15 ENCOUNTER — Other Ambulatory Visit: Payer: Self-pay | Admitting: Family Medicine

## 2018-10-15 LAB — HIV ANTIBODY (ROUTINE TESTING W REFLEX): HIV: NONREACTIVE

## 2018-10-15 LAB — COMPREHENSIVE METABOLIC PANEL
AG Ratio: 2 (calc) (ref 1.0–2.5)
ALT: 19 U/L (ref 9–46)
AST: 17 U/L (ref 10–35)
Albumin: 4.5 g/dL (ref 3.6–5.1)
Alkaline phosphatase (APISO): 100 U/L (ref 40–115)
BILIRUBIN TOTAL: 0.4 mg/dL (ref 0.2–1.2)
BUN: 14 mg/dL (ref 7–25)
CALCIUM: 9.5 mg/dL (ref 8.6–10.3)
CO2: 25 mmol/L (ref 20–32)
Chloride: 100 mmol/L (ref 98–110)
Creat: 0.8 mg/dL (ref 0.70–1.33)
GLUCOSE: 99 mg/dL (ref 65–99)
Globulin: 2.3 g/dL (calc) (ref 1.9–3.7)
Potassium: 4.4 mmol/L (ref 3.5–5.3)
SODIUM: 137 mmol/L (ref 135–146)
TOTAL PROTEIN: 6.8 g/dL (ref 6.1–8.1)

## 2018-10-15 LAB — CBC WITH DIFFERENTIAL/PLATELET
Absolute Monocytes: 406 cells/uL (ref 200–950)
Basophils Absolute: 70 cells/uL (ref 0–200)
Basophils Relative: 1.2 %
EOS PCT: 5.3 %
Eosinophils Absolute: 307 cells/uL (ref 15–500)
HEMATOCRIT: 51.5 % — AB (ref 38.5–50.0)
Hemoglobin: 17.3 g/dL — ABNORMAL HIGH (ref 13.2–17.1)
Lymphs Abs: 1676 cells/uL (ref 850–3900)
MCH: 31.8 pg (ref 27.0–33.0)
MCHC: 33.6 g/dL (ref 32.0–36.0)
MCV: 94.7 fL (ref 80.0–100.0)
MPV: 10.5 fL (ref 7.5–12.5)
Monocytes Relative: 7 %
NEUTROS ABS: 3341 {cells}/uL (ref 1500–7800)
Neutrophils Relative %: 57.6 %
Platelets: 204 10*3/uL (ref 140–400)
RBC: 5.44 10*6/uL (ref 4.20–5.80)
RDW: 12 % (ref 11.0–15.0)
Total Lymphocyte: 28.9 %
WBC: 5.8 10*3/uL (ref 3.8–10.8)

## 2018-10-15 LAB — LIPID PANEL
Cholesterol: 257 mg/dL — ABNORMAL HIGH (ref <200)
HDL: 51 mg/dL (ref >40)
LDL CHOLESTEROL (CALC): 177 mg/dL — AB
NON-HDL CHOLESTEROL (CALC): 206 mg/dL — AB (ref <130)
Total CHOL/HDL Ratio: 5 (calc) — ABNORMAL HIGH (ref <5.0)
Triglycerides: 147 mg/dL (ref <150)

## 2018-10-20 ENCOUNTER — Other Ambulatory Visit: Payer: Self-pay | Admitting: *Deleted

## 2018-10-20 MED ORDER — ATORVASTATIN CALCIUM 10 MG PO TABS: 10.0000 mg | ORAL_TABLET | Freq: Every day | ORAL | 3 refills | Status: DC

## 2018-10-22 ENCOUNTER — Other Ambulatory Visit: Payer: Self-pay | Admitting: Family Medicine

## 2018-10-23 ENCOUNTER — Other Ambulatory Visit: Payer: Self-pay | Admitting: Family Medicine

## 2018-10-23 DIAGNOSIS — I10 Essential (primary) hypertension: Secondary | ICD-10-CM

## 2018-10-23 MED ORDER — ALPRAZOLAM 1 MG PO TABS: 1.0000 mg | ORAL_TABLET | Freq: Three times a day (TID) | ORAL | 2 refills | Status: DC | PRN

## 2018-10-23 MED ORDER — LOSARTAN POTASSIUM 25 MG PO TABS: 50.0000 mg | ORAL_TABLET | Freq: Every day | ORAL | 3 refills | Status: DC

## 2018-11-04 ENCOUNTER — Ambulatory Visit: Payer: BLUE CROSS/BLUE SHIELD | Admitting: Family Medicine

## 2019-01-18 ENCOUNTER — Other Ambulatory Visit: Payer: Self-pay | Admitting: Family Medicine

## 2019-01-18 NOTE — Telephone Encounter (Signed)
Ok to refill??  Last office visit 10/13/2018.  Last refill 10/23/2018, #2 refills.

## 2019-02-11 ENCOUNTER — Other Ambulatory Visit: Payer: Self-pay | Admitting: Family Medicine

## 2019-03-19 ENCOUNTER — Other Ambulatory Visit: Payer: Self-pay | Admitting: Family Medicine

## 2019-03-19 NOTE — Telephone Encounter (Signed)
Last office visit: 10/13/2018 Last refilled: 01/18/2019

## 2019-04-10 ENCOUNTER — Other Ambulatory Visit: Payer: Self-pay | Admitting: Family Medicine

## 2019-05-24 ENCOUNTER — Other Ambulatory Visit: Payer: Self-pay | Admitting: Family Medicine

## 2019-05-24 NOTE — Telephone Encounter (Signed)
Ok to refill??  Last office visit 10/13/2018.  Last refill 03/19/2019, #1 refill.

## 2019-06-07 ENCOUNTER — Other Ambulatory Visit: Payer: Self-pay

## 2019-06-07 ENCOUNTER — Ambulatory Visit: Payer: BC Managed Care – PPO | Admitting: Family Medicine

## 2019-06-07 ENCOUNTER — Encounter: Payer: Self-pay | Admitting: Physician Assistant

## 2019-06-07 ENCOUNTER — Encounter: Payer: Self-pay | Admitting: Family Medicine

## 2019-06-07 VITALS — BP 124/88 | HR 80 | Temp 98.4°F | Resp 14 | Ht 70.0 in | Wt 242.0 lb

## 2019-06-07 DIAGNOSIS — R197 Diarrhea, unspecified: Secondary | ICD-10-CM | POA: Diagnosis not present

## 2019-06-07 DIAGNOSIS — G8929 Other chronic pain: Secondary | ICD-10-CM | POA: Diagnosis not present

## 2019-06-07 DIAGNOSIS — K625 Hemorrhage of anus and rectum: Secondary | ICD-10-CM

## 2019-06-07 DIAGNOSIS — M5441 Lumbago with sciatica, right side: Secondary | ICD-10-CM | POA: Diagnosis not present

## 2019-06-07 MED ORDER — CYCLOBENZAPRINE HCL 10 MG PO TABS: 10.0000 mg | ORAL_TABLET | Freq: Three times a day (TID) | ORAL | 1 refills | Status: DC | PRN

## 2019-06-07 MED ORDER — METHYLPREDNISOLONE 4 MG PO TBPK: ORAL_TABLET | ORAL | 0 refills | Status: DC

## 2019-06-07 NOTE — Progress Notes (Signed)
Subjective:    Patient ID: Dennis Crosby, male    DOB: 11-May-1966, 53 y.o.   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 He went down into his yard with wheelbarrow full of bricks and then pushed uphill and was having a hard time so he put the handel onto his abdomen to help it push up the hill.  2 days later he was at work had diarrhea and har BRBPR this occurred 3 times. Nothing occurred over the weekend.   Has back pain in lower back and into right buttocks. No tingling or numbness, no hematuria    No current pain medications    Review Of Systems:  GEN- denies fatigue, fever, weight loss,weakness, recent illness HEENT- denies eye drainage, change in vision, nasal discharge, CVS- denies chest pain, palpitations RESP- denies SOB, cough, wheeze ABD- denies N/V, change in stools, abd pain GU- denies dysuria, hematuria, dribbling, incontinence MSK-+ joint pain, muscle aches, injury Neuro- denies headache, dizziness, syncope, seizure activity       Objective:    BP 124/88   Pulse 80   Temp 98.4 F (36.9 C) (Oral)   Resp 14   Ht 5\' 10"  (1.778 m)   Wt 242 lb (109.8 kg)   SpO2 98%   BMI 34.72 kg/m  GEN- NAD, alert and oriented x3 HEENT- PERRL, EOMI, non injected sclera, pink conjunctiva, MMM, oropharynx clear Neck- Supple, no thyromegaly CVS- RRR, no murmur RESP-CTAB ABD-NABS,soft,NT,ND MSK-TTP bilat lumbar spine and paraspinals, +SLR Right side,decreased ROM NEURO-normaltone lower extremity DTRs symmetric strength equal bilaterally EXT- No edema Pulses- Radial, DP- 2+        Assessment & Plan:      Problem  List Items Addressed This Visit      Unprioritized   Low back pain - Primary    Chronic right sided back pain worsen with him pushing the wheelchair I think this was too much strain on the lower back.  We will put him on Medrol Dosepak I do not want him on NSAIDs as he recently had significant amount of bleeding rectally.  We will also refill his Flexeril.  He will get x-rays of the lumbar spine His straining may be more indicative of internal hemorrhoidsthough that does not quite explain hy he had the diarrhea.  There is no sign of kidney stone or gross hematuria in the setting of his back pain      Relevant Medications   methylPREDNISolone (MEDROL DOSEPAK) 4 MG TBPK tablet   cyclobenzaprine (FLEXERIL) 10 MG tablet   Other Relevant Orders   DG Lumbar Spine Complete    Other Visit Diagnoses    Rectal bleeding       Current rectal bleeding he had negative Cologuard 2018 however he has had a few episodes of rectal bleeding with diarrhea I think that he should be evaluated by   Relevant Orders   Ambulatory referral to Gastroenterology   Diarrhea, unspecified type       Relevant Orders   Ambulatory referral to Gastroenterology      Note: This dictation was prepared with Dragon dictation along  with smaller phrase technology. Any transcriptional errors that result from this process are unintentional.

## 2019-06-07 NOTE — Assessment & Plan Note (Signed)
Chronic right sided back pain worsen with him pushing the wheelchair I think this was too much strain on the lower back.  We will put him on Medrol Dosepak I do not want him on NSAIDs as he recently had significant amount of bleeding rectally.  We will also refill his Flexeril.  He will get x-rays of the lumbar spine His straining may be more indicative of internal hemorrhoidsthough that does not quite explain hy he had the diarrhea.  There is no sign of kidney stone or gross hematuria in the setting of his back pain

## 2019-06-07 NOTE — Patient Instructions (Addendum)
Get the xray  [facility]  [place] [address] Use heat Take flexeril and steroids for inflammation and pain REferral to GI for the rectal bleeding F/U End of December for Physical

## 2019-06-08 ENCOUNTER — Ambulatory Visit
Admission: RE | Admit: 2019-06-08 | Discharge: 2019-06-08 | Disposition: A | Discharge status: Home / Self Care | Payer: BLUE CROSS/BLUE SHIELD | Source: Ambulatory Visit | Attending: Family Medicine | Admitting: Family Medicine

## 2019-06-08 DIAGNOSIS — I700 Atherosclerosis of aorta: Secondary | ICD-10-CM | POA: Diagnosis not present

## 2019-06-08 DIAGNOSIS — S3992XA Unspecified injury of lower back, initial encounter: Secondary | ICD-10-CM | POA: Diagnosis not present

## 2019-06-08 DIAGNOSIS — G8929 Other chronic pain: Secondary | ICD-10-CM

## 2019-06-08 DIAGNOSIS — M5441 Lumbago with sciatica, right side: Secondary | ICD-10-CM

## 2019-06-08 DIAGNOSIS — M5136 Other intervertebral disc degeneration, lumbar region: Secondary | ICD-10-CM | POA: Diagnosis not present

## 2019-06-14 ENCOUNTER — Telehealth: Payer: Self-pay | Admitting: Family Medicine

## 2019-06-14 NOTE — Telephone Encounter (Signed)
Patient left claim form to be filled out  Will route to christina for completion

## 2019-06-18 ENCOUNTER — Ambulatory Visit (INDEPENDENT_AMBULATORY_CARE_PROVIDER_SITE_OTHER): Payer: BC Managed Care – PPO | Admitting: Physician Assistant

## 2019-06-18 ENCOUNTER — Encounter: Payer: Self-pay | Admitting: Physician Assistant

## 2019-06-18 ENCOUNTER — Encounter: Payer: Self-pay | Admitting: Internal Medicine

## 2019-06-18 VITALS — BP 122/84 | HR 76 | Temp 98.4°F | Ht 68.75 in | Wt 239.1 lb

## 2019-06-18 DIAGNOSIS — R197 Diarrhea, unspecified: Secondary | ICD-10-CM

## 2019-06-18 DIAGNOSIS — K921 Melena: Secondary | ICD-10-CM

## 2019-06-18 MED ORDER — SUPREP BOWEL PREP KIT 17.5-3.13-1.6 GM/177ML PO SOLN: 1.0000 | ORAL | 0 refills | Status: DC

## 2019-06-18 NOTE — Progress Notes (Signed)
Assessment and plans reviewed  

## 2019-06-18 NOTE — Progress Notes (Signed)
Chief Complaint: Rectal bleeding, Diarrhea  HPI:    Dennis Crosby is a 53 year old Caucasian male with a past medical history as listed below, who was referred to me by Dennis Rossetti, MD for a complaint of rectal bleeding and diarrhea.      06/07/2019 patient saw PCP with blood in his stool for 5 days.  Describes soft stools bright red blood.  He had not seen any further since 06/04/19.  Apparently patient had been working outside his home doing yard work putting down [employer] and had some back pain as well as diarrhea with bright red blood 3 times.  He was given steroids and an x-ray was done.  It was thought likely had internal hemorrhoids though they were unsure why he had diarrhea.  Reported negative Cologuard in 2018, but it was discussed that he had a few episodes of rectal bleeding with diarrhea is recommended he see our clinic.    Today, the patient presents clinic and explains that about 2 years ago he had an episode of some bright red blood with bowel movements that lasted for a few days and at that time did a Cologuard which was negative.  This slowly went away and patient had not had another problem until recently.  About 2 weeks ago the patient was moving some bricks to the top of his yard and pulled his back but then also started with some diarrhea and seeing some bright red blood in his stools.  This has continued since that time.  Patient tells me he it is always with a bowel movement and denies any rectal pain.  Does tell me that his stool radiates between soft to hard but he is no longer having what he would call diarrhea.    Does admit to family history of polyps in his father who has had 2 colonoscopies with "multiple polyps removed".    Denies fever, chills, weight loss, anorexia, nausea, vomiting, heartburn, reflux or symptoms that awaken him from sleep.  Past Medical History:  Diagnosis Date  . Hypertension     No past surgical history on file.  Current Outpatient  Medications  Medication Sig Dispense Refill  . ALPRAZolam (XANAX) 1 MG tablet TAKE 1 TABLET (1 MG TOTAL) BY MOUTH 3 (THREE) TIMES DAILY AS NEEDED. DUE 6/12 90 tablet 1  . atorvastatin (LIPITOR) 10 MG tablet Take 1 tablet (10 mg total) by mouth daily. 90 tablet 3  . cyclobenzaprine (FLEXERIL) 10 MG tablet Take 1 tablet (10 mg total) by mouth 3 (three) times daily as needed for muscle spasms. 30 tablet 1  . escitalopram (LEXAPRO) 10 MG tablet TAKE 1 TABLET BY MOUTH EVERYDAY AT BEDTIME 90 tablet 2  . esomeprazole (NEXIUM) 40 MG capsule Take 40 mg by mouth every morning.     Marland Kitchen ibuprofen (ADVIL,MOTRIN) 600 MG tablet Take 1 tablet (600 mg total) by mouth every 8 (eight) hours as needed. 30 tablet 0  . losartan (COZAAR) 100 MG tablet TAKE 1/2 TABLET BY MOUTH EVERY DAY 45 tablet 1  . methylPREDNISolone (MEDROL DOSEPAK) 4 MG TBPK tablet Take as directed on package 21 tablet 0  . naproxen (NAPROSYN) 500 MG tablet TAKE 1 TABLET BY MOUTH TWICE A DAY AS NEEDED 30 tablet 1  . tamsulosin (FLOMAX) 0.4 MG CAPS capsule TAKE 1 CAPSULE BY MOUTH EVERY DAY 90 capsule 1  . vitamin B-12 (CYANOCOBALAMIN) 100 MCG tablet Take 100 mcg by mouth daily.     No current facility-administered medications for  this visit.     Allergies as of 06/18/2019  . (No Known Allergies)    Family History  Problem Relation Age of Onset  . Dementia Mother   . Peripheral vascular disease Father   . Heart disease Father   . Mental illness Sister   . Seizures Brother   . Asthma Brother   . Cancer Paternal Grandmother        bone cancer    Social History   Socioeconomic History  . Marital status: Married    Spouse name: Not on file  . Number of children: Not on file  . Years of education: Not on file  . Highest education level: Not on file  Occupational History  . Not on file  Social Needs  . Financial resource strain: Not on file  . Food insecurity    Worry: Not on file    Inability: Not on file  . Transportation needs     Medical: Not on file    Non-medical: Not on file  Tobacco Use  . Smoking status: Former Smoker    Quit date: 01/03/2019    Years since quitting: 0.4  . Smokeless tobacco: Never Used  Substance and Sexual Activity  . Alcohol use: Yes    Comment: socially  . Drug use: No  . Sexual activity: Yes  Lifestyle  . Physical activity    Days per week: Not on file    Minutes per session: Not on file  . Stress: Not on file  Relationships  . Social Herbalist on phone: Not on file    Gets together: Not on file    Attends religious service: Not on file    Active member of club or organization: Not on file    Attends meetings of clubs or organizations: Not on file    Relationship status: Not on file  . Intimate partner violence    Fear of current or ex partner: Not on file    Emotionally abused: Not on file    Physically abused: Not on file    Forced sexual activity: Not on file  Other Topics Concern  . Not on file  Social History Narrative  . Not on file    Review of Systems:    Constitutional: No weight loss, fever or chills Skin: No rash  Cardiovascular: No chest pain Respiratory: No SOB Gastrointestinal: See HPI and otherwise negative Genitourinary: No dysuria  Neurological: No headache, dizziness or syncope Musculoskeletal: No new muscle or joint pain Hematologic: No bruising Psychiatric: No history of depression or anxiety   Physical Exam:  Vital signs: Temp 98.4 F (36.9 C)   Ht 5' 8.75" (1.746 m) Comment: height measured without shoes  Wt 239 lb 2 oz (108.5 kg)   BMI 35.57 kg/m   Constitutional:   Very Pleasant Caucasian male appears to be in NAD, Well developed, Well nourished, alert and cooperative Head:  Normocephalic and atraumatic. Eyes:   PEERL, EOMI. No icterus. Conjunctiva pink. Ears:  Normal auditory acuity. Neck:  Supple Throat: Oral cavity and pharynx without inflammation, swelling or lesion.  Respiratory: Respirations even and unlabored.  Lungs clear to auscultation bilaterally.   No wheezes, crackles, or rhonchi.  Cardiovascular: Normal S1, S2. No MRG. Regular rate and rhythm. No peripheral edema, cyanosis or pallor.  Gastrointestinal:  Soft, nondistended, nontender. No rebound or guarding. Normal bowel sounds. No appreciable masses or hepatomegaly. Rectal: Declined Msk:  Symmetrical without gross deformities. Without edema, no deformity or  joint abnormality.  Neurologic:  Alert and  oriented x4;  grossly normal neurologically.  Skin:   Dry and intact without significant lesions or rashes. Psychiatric: Demonstrates good judgement and reason without abnormal affect or behaviors.  No recent labs or imaging.  Assessment: 1.  Hematochezia: Blood mixed with stool over the past 3 weeks, this occurred previously 2 years ago with a negative Cologuard, now will not stop for the patient, initially with loose stool, now stool is mixed, all started after carrying heavy bricks in a wheelbarrow, family history of polyps in his father; consider most likely hemorrhoids versus polyps versus other 2.  Diarrhea: Now better per patient  Plan: 1.  Scheduled patient for a diagnostic colonoscopy in the [place] with Dr. Henrene Pastor.  Did discuss risks, benefits, limitations and alternatives the patient agrees to proceed. 2.  Patient to follow in clinic per recommendations from Dr. Henrene Pastor after time of procedure.  Ellouise Newer, PA-C [facility] 06/18/2019, 8:26 AM  Cc: Dennis Rossetti, MD

## 2019-06-18 NOTE — Patient Instructions (Signed)
You have been scheduled for a colonoscopy. Please follow written instructions given to you at your visit today.  Please pick up your prep supplies at the pharmacy within the next 1-3 days. If you use inhalers (even only as needed), please bring them with you on the day of your procedure.  If you are age 52 or older, your body mass index should be between 23-30. Your Body mass index is 35.57 kg/m. If this is out of the aforementioned range listed, please consider follow up with your Primary Care Provider.  If you are age 70 or younger, your body mass index should be between 19-25. Your Body mass index is 35.57 kg/m. If this is out of the aformentioned range listed, please consider follow up with your Primary Care Provider.   We have sent the following medications to your pharmacy for you to pick up at your convenience: Suprep   Thank you for choosing me and [facility].  Dennison Bulla

## 2019-06-22 ENCOUNTER — Other Ambulatory Visit: Payer: Self-pay | Admitting: Family Medicine

## 2019-06-22 NOTE — Telephone Encounter (Signed)
Awaiting forms

## 2019-06-23 NOTE — Telephone Encounter (Signed)
Per MD, [facility] form completed.

## 2019-06-28 ENCOUNTER — Telehealth: Payer: Self-pay | Admitting: Internal Medicine

## 2019-06-28 NOTE — Telephone Encounter (Signed)
Do you now or have you had a fever in the last 14 days?    No ° °  ° °Do you have any respiratory symptoms of shortness of breath or cough now or in the last 14 days?   No ° °  ° °Do you have any family members or close contacts with diagnosed or suspected Covid-19 in the past 14 days?   No  ° °  ° °Have you been tested for Covid-19 and found to be positive?   No ° °  ° °Pt made aware to check in on theth floor and that care partner may wait in the car or come up to the lobby during the procedure but will need to provide their own mask. °

## 2019-06-29 ENCOUNTER — Other Ambulatory Visit: Payer: Self-pay

## 2019-06-29 ENCOUNTER — Encounter: Payer: Self-pay | Admitting: Internal Medicine

## 2019-06-29 ENCOUNTER — Ambulatory Visit (AMBULATORY_SURGERY_CENTER): Payer: BC Managed Care – PPO | Admitting: Internal Medicine

## 2019-06-29 VITALS — BP 146/92 | HR 69 | Temp 98.1°F | Resp 14 | Ht 68.0 in | Wt 239.0 lb

## 2019-06-29 DIAGNOSIS — K5730 Diverticulosis of large intestine without perforation or abscess without bleeding: Secondary | ICD-10-CM

## 2019-06-29 DIAGNOSIS — D122 Benign neoplasm of ascending colon: Secondary | ICD-10-CM | POA: Diagnosis not present

## 2019-06-29 DIAGNOSIS — K648 Other hemorrhoids: Secondary | ICD-10-CM | POA: Diagnosis not present

## 2019-06-29 DIAGNOSIS — D123 Benign neoplasm of transverse colon: Secondary | ICD-10-CM | POA: Diagnosis not present

## 2019-06-29 DIAGNOSIS — Z1211 Encounter for screening for malignant neoplasm of colon: Secondary | ICD-10-CM

## 2019-06-29 MED ORDER — SODIUM CHLORIDE 0.9 % IV SOLN: 500.0000 mL | Freq: Once | INTRAVENOUS | Status: DC

## 2019-06-29 NOTE — Progress Notes (Signed)
Temp check by AM/vital check by CW.  Patient medical history reviewed.  No changes made.

## 2019-06-29 NOTE — Op Note (Signed)
[place] Patient Name: Dennis Crosby Procedure Date: 06/29/2019 7:58 AM MRN: [id number] Endoscopist: Docia Chuck. Henrene Pastor , MD Age: 53 Referring MD:  Date of Birth: 01/07/1966 Gender: Male Account #: [id number] Procedure:                Colonoscopy with cold snare polypectomy x 3 Indications:              Screening for colorectal malignant neoplasm; minor                            and transient diarrhea and BRBPR Medicines:                Monitored Anesthesia Care Procedure:                Pre-Anesthesia Assessment:                           - Prior to the procedure, a History and Physical                            was performed, and patient medications and                            allergies were reviewed. The patient's tolerance of                            previous anesthesia was also reviewed. The risks                            and benefits of the procedure and the sedation                            options and risks were discussed with the patient.                            All questions were answered, and informed consent                            was obtained. Prior Anticoagulants: The patient has                            taken no previous anticoagulant or antiplatelet                            agents. ASA Grade Assessment: II - A patient with                            mild systemic disease. After reviewing the risks                            and benefits, the patient was deemed in                            satisfactory condition to undergo the procedure.  After obtaining informed consent, the colonoscope                            was passed under direct vision. Throughout the                            procedure, the patient's blood pressure, pulse, and                            oxygen saturations were monitored continuously. The                            Colonoscope was introduced through the anus and   advanced to the the cecum, identified by                            appendiceal orifice and ileocecal valve. The                            ileocecal valve, appendiceal orifice, and rectum                            were photographed. The quality of the bowel                            preparation was excellent. The colonoscopy was                            performed without difficulty. The patient tolerated                            the procedure well. The bowel preparation used was                            SUPREP via split dose instruction. Scope In: 8:12:38 AM Scope Out: 8:28:37 AM Scope Withdrawal Time: 0 hours 11 minutes 21 seconds  Total Procedure Duration: 0 hours 15 minutes 59 seconds  Findings:                 Three polyps were found in the transverse colon and                            ascending colon. The polyps were 1 to 4 mm in size.                            These polyps were removed with a cold snare.                            Resection and retrieval were complete.                           Multiple diverticula were found in the left colon.  Internal hemorrhoids were found during                            retroflexion. The hemorrhoids were moderate.                           The exam was otherwise without abnormality on                            direct and retroflexion views. Complications:            No immediate complications. Estimated blood loss:                            None. Estimated Blood Loss:     Estimated blood loss: none. Impression:               - Three 1 to 4 mm polyps in the transverse colon                            and in the ascending colon, removed with a cold                            snare. Resected and retrieved.                           - Diverticulosis in the left colon.                           - Internal hemorrhoids.                           - The examination was otherwise normal on direct                             and retroflexion views. Recommendation:           - Repeat colonoscopy in 5 years for surveillance.                           - Patient has a contact number available for                            emergencies. The signs and symptoms of potential                            delayed complications were discussed with the                            patient. Return to normal activities tomorrow.                            Written discharge instructions were provided to the                            patient.                           -  Resume previous diet.                           - Continue present medications.                           - Await pathology results. Docia Chuck. Henrene Pastor, MD 06/29/2019 8:41:34 AM This report has been signed electronically.

## 2019-06-29 NOTE — Progress Notes (Signed)
A and O x3. Report to RN. Tolerated MAC anesthesia well.

## 2019-06-29 NOTE — Patient Instructions (Signed)
YOU HAD AN ENDOSCOPIC PROCEDURE TODAY AT THE [facility]:   Refer to the procedure report that was given to you for any specific questions about what was found during the examination.  If the procedure report does not answer your questions, please call your gastroenterologist to clarify.  If you requested that your care partner not be given the details of your procedure findings, then the procedure report has been included in a sealed envelope for you to review at your convenience later.  YOU SHOULD EXPECT: Some feelings of bloating in the abdomen. Passage of more gas than usual.  Walking can help get rid of the air that was put into your GI tract during the procedure and reduce the bloating. If you had a lower endoscopy (such as a colonoscopy or flexible sigmoidoscopy) you may notice spotting of blood in your stool or on the toilet paper. If you underwent a bowel prep for your procedure, you may not have a normal bowel movement for a few days.  Please Note:  You might notice some irritation and congestion in your nose or some drainage.  This is from the oxygen used during your procedure.  There is no need for concern and it should clear up in a day or so.  SYMPTOMS TO REPORT IMMEDIATELY:   Following lower endoscopy (colonoscopy or flexible sigmoidoscopy):  Excessive amounts of blood in the stool  Significant tenderness or worsening of abdominal pains  Swelling of the abdomen that is new, acute  Fever of 100F or higher  Please see handouts given to you on Polyps, Diverticulosis and Hemorrhoids.  For urgent or emergent issues, a gastroenterologist can be reached at any hour by calling [phone].   DIET:  We do recommend a small meal at first, but then you may proceed to your regular diet.  Drink plenty of fluids but you should avoid alcoholic beverages for 24 hours.  ACTIVITY:  You should plan to take it easy for the rest of today and you should NOT DRIVE or use heavy  machinery until tomorrow (because of the sedation medicines used during the test).    FOLLOW UP: Our staff will call the number listed on your records 48-72 hours following your procedure to check on you and address any questions or concerns that you may have regarding the information given to you following your procedure. If we do not reach you, we will leave a message.  We will attempt to reach you two times.  During this call, we will ask if you have developed any symptoms of COVID 19. If you develop any symptoms (ie: fever, flu-like symptoms, shortness of breath, cough etc.) before then, please call [phone].  If you test positive for Covid 19 in the 2 weeks post procedure, please call and report this information to us.    If any biopsies were taken you will be contacted by phone or by letter within the next 1-3 weeks.  Please call us at [phone] if you have not heard about the biopsies in 3 weeks.    SIGNATURES/CONFIDENTIALITY: You and/or your care partner have signed paperwork which will be entered into your electronic medical record.  These signatures attest to the fact that that the information above on your After Visit Summary has been reviewed and is understood.  Full responsibility of the confidentiality of this discharge information lies with you and/or your care-partner.  Thank you for letting us take care of your healthcare needs today. 

## 2019-06-29 NOTE — Progress Notes (Signed)
Called to room to assist during endoscopic procedure.  Patient ID and intended procedure confirmed with present staff. Received instructions for my participation in the procedure from the performing physician.  

## 2019-07-01 ENCOUNTER — Telehealth: Payer: Self-pay | Admitting: *Deleted

## 2019-07-01 NOTE — Telephone Encounter (Signed)
  Follow up Call-  Call back number 06/29/2019  Post procedure Call Back phone  # [phone]  Permission to leave phone message Yes  Some recent data might be hidden     Patient questions:  Message left to call us if necessary.

## 2019-07-01 NOTE — Telephone Encounter (Signed)
  Follow up Call-  Call back number 06/29/2019  Post procedure Call Back phone  # [phone]  Permission to leave phone message Yes  Some recent data might be hidden     Patient questions:  Do you have a fever, pain , or abdominal swelling? No. Pain Score  0 *  Have you tolerated food without any problems? Yes.    Have you been able to return to your normal activities? Yes.    Do you have any questions about your discharge instructions: Diet   No. Medications  No. Follow up visit  No.  Do you have questions or concerns about your Care? No.  Actions: * If pain score is 4 or above: No action needed, pain <4.  1. Have you developed a fever since your procedure? no  2.   Have you had an respiratory symptoms (SOB or cough) since your procedure? no  3.   Have you tested positive for COVID 19 since your procedure no  4.   Have you had any family members/close contacts diagnosed with the COVID 19 since your procedure?  no   If yes to any of these questions please route to Joylene John, RN and Alphonsa Gin, Therapist, sports.

## 2019-07-06 ENCOUNTER — Encounter: Payer: Self-pay | Admitting: Internal Medicine

## 2019-07-20 ENCOUNTER — Other Ambulatory Visit: Payer: Self-pay | Admitting: Family Medicine

## 2019-07-21 NOTE — Telephone Encounter (Signed)
Ok to refill Xanax??   Last office visit 06/07/2019.  Last refill 05/24/2019, #1 refill.

## 2019-08-16 ENCOUNTER — Encounter: Payer: BLUE CROSS/BLUE SHIELD | Admitting: Family Medicine

## 2019-09-06 ENCOUNTER — Other Ambulatory Visit: Payer: Self-pay | Admitting: Family Medicine

## 2019-09-06 NOTE — Telephone Encounter (Signed)
Ok to refill 

## 2019-09-21 ENCOUNTER — Other Ambulatory Visit: Payer: Self-pay | Admitting: Family Medicine

## 2019-09-21 NOTE — Telephone Encounter (Signed)
Ok to refill??  Last office visit 06/07/2019.  Last refill 07/21/2019, #1 refill.

## 2019-09-23 ENCOUNTER — Encounter: Payer: Self-pay | Admitting: Family Medicine

## 2019-09-23 ENCOUNTER — Other Ambulatory Visit: Payer: Self-pay

## 2019-09-23 ENCOUNTER — Ambulatory Visit: Payer: BC Managed Care – PPO | Admitting: Family Medicine

## 2019-09-23 VITALS — BP 138/82 | HR 90 | Temp 98.5°F | Resp 14 | Ht 68.0 in | Wt 242.0 lb

## 2019-09-23 DIAGNOSIS — S63502A Unspecified sprain of left wrist, initial encounter: Secondary | ICD-10-CM

## 2019-09-23 DIAGNOSIS — S91205A Unspecified open wound of left lesser toe(s) with damage to nail, initial encounter: Secondary | ICD-10-CM

## 2019-09-23 DIAGNOSIS — M25561 Pain in right knee: Secondary | ICD-10-CM | POA: Diagnosis not present

## 2019-09-23 DIAGNOSIS — T148XXA Other injury of unspecified body region, initial encounter: Secondary | ICD-10-CM

## 2019-09-23 DIAGNOSIS — S91209A Unspecified open wound of unspecified toe(s) with damage to nail, initial encounter: Secondary | ICD-10-CM

## 2019-09-23 MED ORDER — CEPHALEXIN 500 MG PO CAPS: 500.0000 mg | ORAL_CAPSULE | Freq: Three times a day (TID) | ORAL | 0 refills | Status: DC

## 2019-09-23 NOTE — Progress Notes (Signed)
Subjective:     Patient ID: Dennis Crosby, male   DOB: 1966-07-27, 53 y.o.   MRN: [id number]  HPI Yesterday evening, the patient was working at his house.  There was a pallet in his driveway where he was working.  The patient accidentally tripped over the palate.  He struck the palate with his great toe on his left foot.  The toe began bleeding.  He partially avulsed the distal portion of his left great toenail.  There is a large splinter in the tip of the left great toe with blood around it.  The splinter has been in the toe since last night.  There is erythema around the splinter.  The toe is warm and tender to the touch.  The left second toenail was avulsed approximately 75%.  It is loose and not completely adherent to the underlying nailbed.  The patient suffered an abrasion to the anterior left shin with 4 separate diagonal scratches.  These are not bleeding there is no evidence of secondary cellulitis.  He struck his right knee directly on the patella on the concrete as he fell.  The patella is tender to the touch with some bruising.  There is a slight effusion.  The patient is able to flex and extend the joint without pain however he is tender to palpation over the patella and complains of pain with weightbearing.  He also fell and braced himself with his left wrist.  He now has some pain over the ulnar aspect of the left wrist.  There is no erythema or bruising or swelling.  There is no tenderness to palpation over the distal ulna.  There is no pain with palpation over the anatomic snuffbox.  He has full flexion and extension without pain.  There is no tenderness to palpation over the carpal row or the metacarpals.  I believe the patient sprained his wrist as he fell.  Past Medical History:  Diagnosis Date  . Anxiety   . GERD (gastroesophageal reflux disease)   . Hypertension   . Melanoma [facility])    Past Surgical History:  Procedure Laterality Date  . SKIN CANCER EXCISION     Current  Outpatient Medications on File Prior to Visit  Medication Sig Dispense Refill  . atorvastatin (LIPITOR) 10 MG tablet TAKE 1 TABLET BY MOUTH EVERY DAY 90 tablet 3  . tamsulosin (FLOMAX) 0.4 MG CAPS capsule TAKE 1 CAPSULE BY MOUTH EVERY DAY 90 capsule 1  . ALPRAZolam (XANAX) 1 MG tablet TAKE 1 TABLET BY MOUTH THREE TIMES A DAY AS NEEDED 90 tablet 1  . cyclobenzaprine (FLEXERIL) 10 MG tablet TAKE 1 TABLET BY MOUTH THREE TIMES A DAY AS NEEDED FOR MUSCLE SPASMS 30 tablet 1  . escitalopram (LEXAPRO) 10 MG tablet TAKE 1 TABLET BY MOUTH EVERYDAY AT BEDTIME 90 tablet 2  . esomeprazole (NEXIUM) 40 MG capsule Take 40 mg by mouth every morning.     Marland Kitchen ibuprofen (ADVIL,MOTRIN) 600 MG tablet Take 1 tablet (600 mg total) by mouth every 8 (eight) hours as needed. 30 tablet 0  . losartan (COZAAR) 100 MG tablet TAKE 1/2 TABLET BY MOUTH EVERY DAY 45 tablet 1  . losartan (COZAAR) 50 MG tablet TAKE 1 TABLET BY MOUTH EVERY DAY 90 tablet 3  . methylPREDNISolone (MEDROL DOSEPAK) 4 MG TBPK tablet Take as directed on package (Patient not taking: Reported on 06/29/2019) 21 tablet 0  . naproxen (NAPROSYN) 500 MG tablet TAKE 1 TABLET BY MOUTH TWICE A DAY AS NEEDED 30  tablet 1  . vitamin B-12 (CYANOCOBALAMIN) 100 MCG tablet Take 100 mcg by mouth daily.     No current facility-administered medications on file prior to visit.   No Known Allergies Social History   Socioeconomic History  . Marital status: Married    Spouse name: Not on file  . Number of children: 1  . Years of education: Not on file  . Highest education level: Not on file  Occupational History  . Not on file  Tobacco Use  . Smoking status: Former Smoker    Quit date: 01/03/2019    Years since quitting: 0.7  . Smokeless tobacco: Never Used  Substance and Sexual Activity  . Alcohol use: Yes    Comment: 2 per day  . Drug use: No  . Sexual activity: Yes  Other Topics Concern  . Not on file  Social History Narrative  . Not on file   Social  Determinants of Health   Financial Resource Strain:   . Difficulty of Paying Living Expenses: Not on file  Food Insecurity:   . Worried About Charity fundraiser in the Last Year: Not on file  . Ran Out of Food in the Last Year: Not on file  Transportation Needs:   . Lack of Transportation (Medical): Not on file  . Lack of Transportation (Non-Medical): Not on file  Physical Activity:   . Days of Exercise per Week: Not on file  . Minutes of Exercise per Session: Not on file  Stress:   . Feeling of Stress : Not on file  Social Connections:   . Frequency of Communication with Friends and Family: Not on file  . Frequency of Social Gatherings with Friends and Family: Not on file  . Attends Religious Services: Not on file  . Active Member of Clubs or Organizations: Not on file  . Attends Archivist Meetings: Not on file  . Marital Status: Not on file  Intimate Partner Violence:   . Fear of Current or Ex-Partner: Not on file  . Emotionally Abused: Not on file  . Physically Abused: Not on file  . Sexually Abused: Not on file     Review of Systems  All other systems reviewed and are negative.      Objective:   Physical Exam Vitals reviewed.  Constitutional:      Appearance: Normal appearance.  Cardiovascular:     Rate and Rhythm: Normal rate and regular rhythm.     Heart sounds: Normal heart sounds. No murmur. No gallop.   Pulmonary:     Effort: Pulmonary effort is normal. No respiratory distress.     Breath sounds: Normal breath sounds. No stridor. No wheezing, rhonchi or rales.  Musculoskeletal:     Left wrist: Swelling and tenderness present. No deformity, effusion, lacerations, bony tenderness, snuff box tenderness or crepitus. Normal range of motion. Normal pulse.     Right knee: Effusion and bony tenderness present. No erythema or crepitus. Normal range of motion. No tenderness. No medial joint line, lateral joint line, MCL, LCL, ACL or PCL tenderness. Normal  alignment.     Left foot: Swelling, deformity and tenderness present.       Legs:  Neurological:     Mental Status: He is alert.        Assessment:     Acute pain of right knee - Plan: DG Knee Complete 4 Views Right  Toenail avulsion, initial encounter  Splinter in skin  Sprain of left wrist, initial  encounter      Plan:     First, the splinter was removed from the distal portion of the left great toe.  There was some bleeding after the splinter was removed and the wound was closed with Neosporin and a Band-Aid.  The patient appears to be developing secondary cellulitis in the great toe.  Therefore I will start the patient on Keflex 500 mg p.o. 3 times daily for 7 days.  Second, the patient has separated his left second toenail from the underlying nailbed approximately 75%.  The toe was anesthetized with 0.1% lidocaine without epinephrine.  The nail was then separated from the remaining portion of the nailbed using a metal elevator.  The toenail was removed in its entirety using a pair of hemostats with traction.  The nailbed was covered with Neosporin, wrapped in petroleum gauze, and then wrapped in Coban.  There was minimal blood loss.  Patient tolerated the procedure well without complication.  Third the abrasion over the left anterior shin was cleaned thoroughly and covered with Neosporin and a Band-Aid.  I believe the patient has a bony contusion to his right patella.  I did order an x-ray of the right knee to rule out a fracture in the patella.  If pain is severe, may need to evaluate the patient as the swelling subsides to evaluate for any ligament injury however the present time it appears as though he only suffered a bony contusion to the patella.  I believe his left wrist is simply sprained.  There is no tenderness to palpation over the bones.  There is some mild tenderness with range of motion but the patient is able to show full range of motion without really any significant  pain.

## 2019-10-21 ENCOUNTER — Other Ambulatory Visit: Payer: Self-pay | Admitting: Family Medicine

## 2019-10-28 ENCOUNTER — Encounter: Payer: Self-pay | Admitting: Family Medicine

## 2019-11-02 ENCOUNTER — Other Ambulatory Visit: Payer: Self-pay | Admitting: Family Medicine

## 2019-11-02 NOTE — Telephone Encounter (Signed)
Ok to refill 

## 2019-11-19 ENCOUNTER — Other Ambulatory Visit: Payer: Self-pay | Admitting: Family Medicine

## 2019-11-19 NOTE — Telephone Encounter (Signed)
Pt is requesting refill on Xanax   LOV: 06/07/19  LRF:   09/21/19

## 2019-12-10 ENCOUNTER — Other Ambulatory Visit: Payer: Self-pay | Admitting: Family Medicine

## 2019-12-10 MED ORDER — CYCLOBENZAPRINE HCL 10 MG PO TABS: ORAL_TABLET | ORAL | 1 refills | Status: DC

## 2019-12-10 NOTE — Addendum Note (Signed)
Addended by: Sheral Flow on: 12/10/2019 02:15 PM   Modules accepted: Orders

## 2019-12-10 NOTE — Telephone Encounter (Signed)
Ok to refill 

## 2019-12-10 NOTE — Telephone Encounter (Signed)
Please re-send.   Transmission to pharmacy failed.

## 2019-12-20 ENCOUNTER — Other Ambulatory Visit: Payer: Self-pay | Admitting: Family Medicine

## 2020-01-13 ENCOUNTER — Other Ambulatory Visit: Payer: Self-pay | Admitting: Family Medicine

## 2020-01-17 NOTE — Telephone Encounter (Signed)
Ok to refill??  Last office visit 06/07/2019.  Last refill 11/19/2019, #1 refill.

## 2020-02-18 ENCOUNTER — Other Ambulatory Visit: Payer: Self-pay | Admitting: Family Medicine

## 2020-02-21 NOTE — Telephone Encounter (Signed)
Ok to refill 

## 2020-03-21 ENCOUNTER — Other Ambulatory Visit: Payer: Self-pay | Admitting: Family Medicine

## 2020-03-21 NOTE — Telephone Encounter (Signed)
Ok to refill??  Last office visit 06/07/2019.  Last refill 01/17/2020, #1 refill.

## 2020-03-29 ENCOUNTER — Other Ambulatory Visit: Payer: Self-pay | Admitting: Family Medicine

## 2020-03-30 NOTE — Telephone Encounter (Signed)
Ok to refill??  Last office visit  Last refill

## 2020-03-31 NOTE — Telephone Encounter (Signed)
Please schedule Physical for pt, has not been seen in 6 months

## 2020-04-06 ENCOUNTER — Telehealth: Payer: Self-pay | Admitting: *Deleted

## 2020-04-06 NOTE — Telephone Encounter (Signed)
Call placed to patient. LMTRC.  

## 2020-04-06 NOTE — Telephone Encounter (Signed)
-----   Message from Alycia Rossetti, MD sent at 04/05/2020  5:28 PM EDT ----- Regarding: Pt has appt Friday, advise him not to eat lunch, he can eat breakfast, overdue for labs

## 2020-04-07 ENCOUNTER — Encounter: Payer: Self-pay | Admitting: Family Medicine

## 2020-04-07 ENCOUNTER — Ambulatory Visit (INDEPENDENT_AMBULATORY_CARE_PROVIDER_SITE_OTHER): Payer: 59 | Admitting: Family Medicine

## 2020-04-07 ENCOUNTER — Other Ambulatory Visit: Payer: Self-pay

## 2020-04-07 VITALS — BP 130/74 | HR 82 | Temp 98.9°F | Resp 14 | Ht 68.0 in | Wt 262.0 lb

## 2020-04-07 DIAGNOSIS — F411 Generalized anxiety disorder: Secondary | ICD-10-CM | POA: Diagnosis not present

## 2020-04-07 DIAGNOSIS — E782 Mixed hyperlipidemia: Secondary | ICD-10-CM | POA: Diagnosis not present

## 2020-04-07 DIAGNOSIS — E669 Obesity, unspecified: Secondary | ICD-10-CM

## 2020-04-07 DIAGNOSIS — M5136 Other intervertebral disc degeneration, lumbar region: Secondary | ICD-10-CM

## 2020-04-07 DIAGNOSIS — I10 Essential (primary) hypertension: Secondary | ICD-10-CM

## 2020-04-07 DIAGNOSIS — F331 Major depressive disorder, recurrent, moderate: Secondary | ICD-10-CM | POA: Diagnosis not present

## 2020-04-07 MED ORDER — HYDROCODONE-ACETAMINOPHEN 5-325 MG PO TABS: 1.0000 | ORAL_TABLET | Freq: Two times a day (BID) | ORAL | 0 refills | Status: DC | PRN

## 2020-04-07 NOTE — Progress Notes (Signed)
Subjective:    Patient ID: Dennis Crosby, male    DOB: 10-27-1965, 54 y.o.   MRN: [id number]  Patient presents for Follow-up (is not fasting)    Pt here with worsening back pain  He has picked up more weight 20lbs    He removed his wallet his back pocket and that has helped some.  He had an x-ray that showed degenerative changes in his spine.  He does not have any radicular symptoms.  The pain is across his lower back.  He would like to go to a chiropractor.  He also has some continued need for pain medicine.. Using flexeril as needed      Obesity- he would like to try something to help with his weight, he admits he eats out a lot, he feels hungry and snacks in the evening He does try to drink a lot of water during his work day.   Since he quit smoking 6 months or so ago he is has gained more weight     MDD/GAD- on lexapro doing well on this, feels mood is controlled      Hyperlipidemia due for recheck         Review Of Systems:  GEN- denies fatigue, fever, weight loss,weakness, recent illness HEENT- denies eye drainage, change in vision, nasal discharge, CVS- denies chest pain, palpitations RESP- denies SOB, cough, wheeze ABD- denies N/V, change in stools, abd pain GU- denies dysuria, hematuria, dribbling, incontinence MSK- denies joint pain, muscle aches, injury Neuro- denies headache, dizziness, syncope, seizure activity       Objective:    BP 130/74   Pulse 82   Temp 98.9 F (37.2 C) (Temporal)   Resp 14   Ht 5\' 8"  (1.727 m)   Wt 262 lb (118.8 kg)   SpO2 95%   BMI 39.84 kg/m  GEN- NAD, alert and oriented x3 HEENT- PERRL, EOMI, non injected sclera, pink conjunctiva, MMM, oropharynx clear CVS- RRR, no murmur RESP-CTAB ABD-NABS,soft,NT,ND MSK- Mild TTP lumbar spine and paraspinals, fair ROM spine, neg SLR   Neuro- strength in tact LE, sensation in tact  Psych- normal affect and mood  EXT- No edema Pulses- Radial 2+        Assessment & Plan:       Problem List Items Addressed This Visit      Unprioritized   DDD (degenerative disc disease), lumbar    Xray from last year, showed earlyDDD signs in spine He has removed wallet which could be affected his posture causing pain Reducing weight will also help He does not have radicular symptoms, he can try chiropracter Given short course of norco NSAIDS not helping He can only use norco after work, not with driving, do not take with benzo       Relevant Medications   HYDROcodone-acetaminophen (NORCO) 5-325 MG tablet   GAD (generalized anxiety disorder)   Hyperlipidemia    Return for fasting labs , continue lipitor       Hypertension    Controlled on losartan Return for fasting labs  No changes to meds      MDD (major depressive disorder) - Primary    Doing well on lexapro and prn xanax       Obesity (BMI 30-39.9)    I think he would benefit from GLP-1 therapy Will see if Saxenda or ozempic is covered Prefer to avoid phentermine due to anxiety and HTN Return for labs first          Note:  This dictation was prepared with Dragon dictation along with smaller phrase technology. Any transcriptional errors that result from this process are unintentional.

## 2020-04-07 NOTE — Patient Instructions (Addendum)
Return for fasting labs on Monday  Take hydrocodone in the evening  F/U 6 MONTHS FOR PHYSICAL

## 2020-04-07 NOTE — Telephone Encounter (Signed)
Call placed to patient. LMTRC.  

## 2020-04-09 ENCOUNTER — Encounter: Payer: Self-pay | Admitting: Family Medicine

## 2020-04-09 NOTE — Assessment & Plan Note (Addendum)
I think he would benefit from GLP-1 therapy Will see if Saxenda or ozempic is covered Prefer to avoid phentermine due to anxiety and HTN Return for labs first

## 2020-04-09 NOTE — Assessment & Plan Note (Signed)
Xray from last year, showed earlyDDD signs in spine He has removed wallet which could be affected his posture causing pain Reducing weight will also help He does not have radicular symptoms, he can try chiropracter Given short course of norco NSAIDS not helping He can only use norco after work, not with driving, do not take with benzo

## 2020-04-09 NOTE — Assessment & Plan Note (Signed)
Return for fasting labs , continue lipitor

## 2020-04-09 NOTE — Assessment & Plan Note (Signed)
Doing well on lexapro and prn xanax

## 2020-04-09 NOTE — Assessment & Plan Note (Signed)
Controlled on losartan Return for fasting labs  No changes to meds

## 2020-04-10 ENCOUNTER — Other Ambulatory Visit: Payer: 59

## 2020-04-10 ENCOUNTER — Telehealth: Payer: Self-pay | Admitting: *Deleted

## 2020-04-10 ENCOUNTER — Other Ambulatory Visit: Payer: Self-pay

## 2020-04-10 DIAGNOSIS — E669 Obesity, unspecified: Secondary | ICD-10-CM

## 2020-04-10 DIAGNOSIS — I10 Essential (primary) hypertension: Secondary | ICD-10-CM

## 2020-04-10 DIAGNOSIS — E782 Mixed hyperlipidemia: Secondary | ICD-10-CM

## 2020-04-10 NOTE — Telephone Encounter (Signed)
Received request from pharmacy for PA on Hydrocodone/APAP.  PA submitted.   Dx: M51.36- DDD, lumbar

## 2020-04-10 NOTE — Telephone Encounter (Signed)
This request is still being processed. You may close this dialog, return to your dashboard, and perform other tasks. To check for an update later, open this request again from your dashboard. If you have any questions, please contact [facility] at [phone].

## 2020-04-11 LAB — CBC WITH DIFFERENTIAL/PLATELET
Absolute Monocytes: 340 cells/uL (ref 200–950)
Basophils Absolute: 59 cells/uL (ref 0–200)
Basophils Relative: 1.1 %
Eosinophils Absolute: 270 cells/uL (ref 15–500)
Eosinophils Relative: 5 %
HCT: 44 % (ref 38.5–50.0)
Hemoglobin: 15.1 g/dL (ref 13.2–17.1)
Lymphs Abs: 1350 cells/uL (ref 850–3900)
MCH: 32.6 pg (ref 27.0–33.0)
MCHC: 34.3 g/dL (ref 32.0–36.0)
MCV: 95 fL (ref 80.0–100.0)
MPV: 10.3 fL (ref 7.5–12.5)
Monocytes Relative: 6.3 %
Neutro Abs: 3380 cells/uL (ref 1500–7800)
Neutrophils Relative %: 62.6 %
Platelets: 182 10*3/uL (ref 140–400)
RBC: 4.63 10*6/uL (ref 4.20–5.80)
RDW: 12.2 % (ref 11.0–15.0)
Total Lymphocyte: 25 %
WBC: 5.4 10*3/uL (ref 3.8–10.8)

## 2020-04-11 LAB — COMPREHENSIVE METABOLIC PANEL
AG Ratio: 2.1 (calc) (ref 1.0–2.5)
ALT: 24 U/L (ref 9–46)
AST: 22 U/L (ref 10–35)
Albumin: 4.6 g/dL (ref 3.6–5.1)
Alkaline phosphatase (APISO): 83 U/L (ref 35–144)
BUN: 16 mg/dL (ref 7–25)
CO2: 28 mmol/L (ref 20–32)
Calcium: 9 mg/dL (ref 8.6–10.3)
Chloride: 101 mmol/L (ref 98–110)
Creat: 0.81 mg/dL (ref 0.70–1.33)
Globulin: 2.2 g/dL (calc) (ref 1.9–3.7)
Glucose, Bld: 109 mg/dL — ABNORMAL HIGH (ref 65–99)
Potassium: 4.4 mmol/L (ref 3.5–5.3)
Sodium: 138 mmol/L (ref 135–146)
Total Bilirubin: 0.5 mg/dL (ref 0.2–1.2)
Total Protein: 6.8 g/dL (ref 6.1–8.1)

## 2020-04-11 LAB — LIPID PANEL
Cholesterol: 209 mg/dL — ABNORMAL HIGH (ref <200)
HDL: 51 mg/dL (ref >=40)
LDL Cholesterol (Calc): 116 mg/dL (calc) — ABNORMAL HIGH
Non-HDL Cholesterol (Calc): 158 mg/dL (calc) — ABNORMAL HIGH (ref <130)
Total CHOL/HDL Ratio: 4.1 (calc) (ref <5.0)
Triglycerides: 291 mg/dL — ABNORMAL HIGH (ref <150)

## 2020-04-11 LAB — HEMOGLOBIN A1C
Hgb A1c MFr Bld: 5.7 % of total Hgb — ABNORMAL HIGH (ref <5.7)
Mean Plasma Glucose: 117 (calc)
eAG (mmol/L): 6.5 (calc)

## 2020-04-11 LAB — TSH: TSH: 2.31 mIU/L (ref 0.40–4.50)

## 2020-04-11 NOTE — Telephone Encounter (Signed)
Received PA determination.   PA approved 04/10/2020- 07/09/2020.

## 2020-04-19 ENCOUNTER — Other Ambulatory Visit: Payer: Self-pay | Admitting: Family Medicine

## 2020-04-27 ENCOUNTER — Encounter: Payer: Self-pay | Admitting: *Deleted

## 2020-05-16 ENCOUNTER — Other Ambulatory Visit: Payer: Self-pay | Admitting: Family Medicine

## 2020-05-17 MED ORDER — HYDROCODONE-ACETAMINOPHEN 5-325 MG PO TABS: 1.0000 | ORAL_TABLET | Freq: Two times a day (BID) | ORAL | 0 refills | Status: DC | PRN

## 2020-05-17 NOTE — Telephone Encounter (Signed)
Ok to refill??  Last office visit 04/07/2020.  Last refill 03/21/2020, #1 refill.

## 2020-05-17 NOTE — Telephone Encounter (Signed)
Ok to refill??  Last office visit/ refill 04/07/2020.

## 2020-05-24 ENCOUNTER — Other Ambulatory Visit: Payer: Self-pay | Admitting: Family Medicine

## 2020-05-24 NOTE — Telephone Encounter (Signed)
Ok to refill 

## 2020-06-20 ENCOUNTER — Other Ambulatory Visit: Payer: Self-pay | Admitting: *Deleted

## 2020-06-20 MED ORDER — HYDROCODONE-ACETAMINOPHEN 5-325 MG PO TABS: 1.0000 | ORAL_TABLET | Freq: Two times a day (BID) | ORAL | 0 refills | Status: DC | PRN

## 2020-06-20 NOTE — Telephone Encounter (Signed)
Received call from patient.   Requested refill on Hydrocodone/APAP.  Ok to refill??  Last office visit 04/07/2020.  Last refill 05/17/2020.

## 2020-07-06 ENCOUNTER — Other Ambulatory Visit: Payer: Self-pay | Admitting: Family Medicine

## 2020-07-14 ENCOUNTER — Other Ambulatory Visit: Payer: Self-pay | Admitting: Family Medicine

## 2020-07-14 NOTE — Telephone Encounter (Signed)
Ok to refill??  Last office visit 04/07/2020.  Last refill 05/17/2020, #1 refills.

## 2020-07-17 ENCOUNTER — Other Ambulatory Visit: Payer: Self-pay | Admitting: Family Medicine

## 2020-07-18 MED ORDER — HYDROCODONE-ACETAMINOPHEN 5-325 MG PO TABS
1.0000 | ORAL_TABLET | Freq: Two times a day (BID) | ORAL | 0 refills | Status: DC | PRN
Start: 2020-07-18 — End: 2020-08-11

## 2020-07-18 NOTE — Telephone Encounter (Signed)
Ok to refill??  Last office visit 04/07/2020.  Last refill 06/20/2020.

## 2020-08-11 ENCOUNTER — Other Ambulatory Visit: Payer: Self-pay | Admitting: Family Medicine

## 2020-08-11 MED ORDER — HYDROCODONE-ACETAMINOPHEN 5-325 MG PO TABS
1.0000 | ORAL_TABLET | Freq: Two times a day (BID) | ORAL | 0 refills | Status: DC | PRN
Start: 2020-08-11 — End: 2020-09-12

## 2020-08-21 ENCOUNTER — Other Ambulatory Visit: Payer: Self-pay

## 2020-08-21 ENCOUNTER — Ambulatory Visit (INDEPENDENT_AMBULATORY_CARE_PROVIDER_SITE_OTHER): Payer: 59 | Admitting: Family Medicine

## 2020-08-21 ENCOUNTER — Encounter: Payer: Self-pay | Admitting: Family Medicine

## 2020-08-21 VITALS — BP 122/88 | HR 84 | Temp 97.9°F | Resp 14 | Ht 68.0 in | Wt 269.0 lb

## 2020-08-21 DIAGNOSIS — M5136 Other intervertebral disc degeneration, lumbar region: Secondary | ICD-10-CM | POA: Diagnosis not present

## 2020-08-21 DIAGNOSIS — Z125 Encounter for screening for malignant neoplasm of prostate: Secondary | ICD-10-CM

## 2020-08-21 DIAGNOSIS — E782 Mixed hyperlipidemia: Secondary | ICD-10-CM | POA: Diagnosis not present

## 2020-08-21 DIAGNOSIS — E669 Obesity, unspecified: Secondary | ICD-10-CM

## 2020-08-21 DIAGNOSIS — I10 Essential (primary) hypertension: Secondary | ICD-10-CM | POA: Diagnosis not present

## 2020-08-21 DIAGNOSIS — Z0001 Encounter for general adult medical examination with abnormal findings: Secondary | ICD-10-CM

## 2020-08-21 DIAGNOSIS — R7303 Prediabetes: Secondary | ICD-10-CM

## 2020-08-21 DIAGNOSIS — Z0000 Encounter for general adult medical examination without abnormal findings: Secondary | ICD-10-CM

## 2020-08-21 DIAGNOSIS — Z1159 Encounter for screening for other viral diseases: Secondary | ICD-10-CM

## 2020-08-21 DIAGNOSIS — F411 Generalized anxiety disorder: Secondary | ICD-10-CM

## 2020-08-21 NOTE — Patient Instructions (Signed)
F/U 4 months  

## 2020-08-21 NOTE — Assessment & Plan Note (Signed)
Doing well on lexpro, prn xanax

## 2020-08-21 NOTE — Assessment & Plan Note (Signed)
chronic pain meds chiropracter

## 2020-08-21 NOTE — Progress Notes (Signed)
   Subjective:    Patient ID: Dennis Crosby, male    DOB: [birth year], 54 y.o.   MRN: [id number]  Patient presents for Annual Exam (is fasting)   Pt here for CPE     Meds reviewed  States he feels good, still has stress at work, but things are better He is taking lexapro and the xanax as prescribed  Chronic back pain, he is going to chiropracter which helps, takes norco prn and flexeril      Immunizations- He had Moderna 2nd dose 1 week ago, wants to hold on flu shot today     Colonoscopy UTD done in  2020   Due for PSA screening on flomax for BPH, symptoms controlled with this       Review Of Systems:  GEN- denies fatigue, fever, weight loss,weakness, recent illness HEENT- denies eye drainage, change in vision, nasal discharge, CVS- denies chest pain, palpitations RESP- denies SOB, cough, wheeze ABD- denies N/V, change in stools, abd pain GU- denies dysuria, hematuria, dribbling, incontinence MSK-+ joint pain, muscle aches, injury Neuro- denies headache, dizziness, syncope, seizure activity       Objective:    BP 122/88   Pulse 84   Temp 97.9 F (36.6 C) (Temporal)   Resp 14   Ht 5\' 8"  (1.727 m)   Wt 269 lb (122 kg)   SpO2 96%   BMI 40.90 kg/m  GEN- NAD, alert and oriented x3 HEENT- PERRL, EOMI, non injected sclera, pink conjunctiva, MMM, oropharynx clear,dentures Neck- Supple, no thyromegaly CVS- RRR, no murmur RESP-CTAB ABD-NABS,soft,NT,ND Psych normal affect and mood  EXT- No edema Pulses- Radial, DP- 2+        Assessment & Plan:   Audit C/FALL negative    Problem List Items Addressed This Visit      Unprioritized   Class 3 obesity   DDD (degenerative disc disease), lumbar    chronic pain meds chiropracter      GAD (generalized anxiety disorder)    Doing well on lexpro, prn xanax       Hyperlipidemia   Relevant Orders   Lipid panel   Hypertension    Controlled no changes       Relevant Orders   CBC with Differential/Platelet    Comprehensive metabolic panel    Other Visit Diagnoses    Routine general medical examination at a health care facility    -  Primary   CPE done, fasting labs  obtained, flu shot in 2 weeks    Relevant Orders   CBC with Differential/Platelet   Comprehensive metabolic panel   Pre-diabetes       recheck A1C, discussed dietary changes, reducing carbs/sweets    Relevant Orders   Hemoglobin A1c   Prostate cancer screening       Relevant Orders   PSA   Need for hepatitis C screening test       Relevant Orders   Hepatitis C antibody      Note: This dictation was prepared with Dragon dictation along with smaller phrase technology. Any transcriptional errors that result from this process are unintentional.

## 2020-08-21 NOTE — Assessment & Plan Note (Signed)
Controlled no changes 

## 2020-08-22 LAB — CBC WITH DIFFERENTIAL/PLATELET
Absolute Monocytes: 402 cells/uL (ref 200–950)
Basophils Absolute: 48 cells/uL (ref 0–200)
Basophils Relative: 0.8 %
Eosinophils Absolute: 270 cells/uL (ref 15–500)
Eosinophils Relative: 4.5 %
HCT: 45.7 % (ref 38.5–50.0)
Hemoglobin: 15.6 g/dL (ref 13.2–17.1)
Lymphs Abs: 1278 cells/uL (ref 850–3900)
MCH: 32.6 pg (ref 27.0–33.0)
MCHC: 34.1 g/dL (ref 32.0–36.0)
MCV: 95.6 fL (ref 80.0–100.0)
MPV: 10.3 fL (ref 7.5–12.5)
Monocytes Relative: 6.7 %
Neutro Abs: 4002 cells/uL (ref 1500–7800)
Neutrophils Relative %: 66.7 %
Platelets: 194 10*3/uL (ref 140–400)
RBC: 4.78 10*6/uL (ref 4.20–5.80)
RDW: 12 % (ref 11.0–15.0)
Total Lymphocyte: 21.3 %
WBC: 6 10*3/uL (ref 3.8–10.8)

## 2020-08-22 LAB — COMPREHENSIVE METABOLIC PANEL
AG Ratio: 2 (calc) (ref 1.0–2.5)
ALT: 29 U/L (ref 9–46)
AST: 25 U/L (ref 10–35)
Albumin: 4.5 g/dL (ref 3.6–5.1)
Alkaline phosphatase (APISO): 89 U/L (ref 35–144)
BUN: 13 mg/dL (ref 7–25)
CO2: 23 mmol/L (ref 20–32)
Calcium: 9.6 mg/dL (ref 8.6–10.3)
Chloride: 101 mmol/L (ref 98–110)
Creat: 0.76 mg/dL (ref 0.70–1.33)
Globulin: 2.3 g/dL (calc) (ref 1.9–3.7)
Glucose, Bld: 119 mg/dL — ABNORMAL HIGH (ref 65–99)
Potassium: 4.2 mmol/L (ref 3.5–5.3)
Sodium: 138 mmol/L (ref 135–146)
Total Bilirubin: 0.4 mg/dL (ref 0.2–1.2)
Total Protein: 6.8 g/dL (ref 6.1–8.1)

## 2020-08-22 LAB — HEMOGLOBIN A1C
Hgb A1c MFr Bld: 6 % of total Hgb — ABNORMAL HIGH (ref <5.7)
Mean Plasma Glucose: 126 (calc)
eAG (mmol/L): 7 (calc)

## 2020-08-22 LAB — LIPID PANEL
Cholesterol: 219 mg/dL — ABNORMAL HIGH (ref <200)
HDL: 54 mg/dL (ref >=40)
LDL Cholesterol (Calc): 122 mg/dL (calc) — ABNORMAL HIGH
Non-HDL Cholesterol (Calc): 165 mg/dL (calc) — ABNORMAL HIGH (ref <130)
Total CHOL/HDL Ratio: 4.1 (calc) (ref <5.0)
Triglycerides: 299 mg/dL — ABNORMAL HIGH (ref <150)

## 2020-08-22 LAB — HEPATITIS C ANTIBODY
Hepatitis C Ab: NONREACTIVE
SIGNAL TO CUT-OFF: 0.01 (ref <1.00)

## 2020-08-22 LAB — PSA: PSA: 0.74 ng/mL (ref <=4.0)

## 2020-08-30 ENCOUNTER — Encounter: Payer: Self-pay | Admitting: Family Medicine

## 2020-08-30 ENCOUNTER — Other Ambulatory Visit: Payer: Self-pay | Admitting: Family Medicine

## 2020-08-30 NOTE — Telephone Encounter (Signed)
Ok to refill 

## 2020-09-12 ENCOUNTER — Other Ambulatory Visit: Payer: Self-pay | Admitting: Family Medicine

## 2020-09-13 MED ORDER — HYDROCODONE-ACETAMINOPHEN 5-325 MG PO TABS
1.0000 | ORAL_TABLET | Freq: Two times a day (BID) | ORAL | 0 refills | Status: DC | PRN
Start: 2020-09-13 — End: 2020-10-16

## 2020-09-13 NOTE — Telephone Encounter (Signed)
Ok to refill??  Last office visit 08/21/2020.  Last refill 08/11/2020.

## 2020-09-15 ENCOUNTER — Other Ambulatory Visit: Payer: Self-pay

## 2020-09-15 MED ORDER — ESCITALOPRAM OXALATE 10 MG PO TABS: ORAL_TABLET | ORAL | 2 refills | Status: AC

## 2020-10-16 ENCOUNTER — Other Ambulatory Visit: Payer: Self-pay | Admitting: Family Medicine

## 2020-10-16 NOTE — Telephone Encounter (Signed)
Ok to refill??  Last office visit 08/21/2020.  Last refill 09/13/2020.

## 2020-10-17 MED ORDER — HYDROCODONE-ACETAMINOPHEN 5-325 MG PO TABS: 1.0000 | ORAL_TABLET | Freq: Two times a day (BID) | ORAL | 0 refills | Status: DC | PRN

## 2020-10-20 ENCOUNTER — Ambulatory Visit: Payer: 59 | Admitting: Family Medicine

## 2020-10-24 ENCOUNTER — Encounter: Payer: Self-pay | Admitting: *Deleted

## 2020-10-25 ENCOUNTER — Telehealth: Payer: Self-pay | Admitting: Family Medicine

## 2020-10-30 ENCOUNTER — Other Ambulatory Visit: Payer: Self-pay | Admitting: Family Medicine

## 2020-11-13 ENCOUNTER — Other Ambulatory Visit: Payer: Self-pay | Admitting: Family Medicine

## 2020-11-13 MED ORDER — HYDROCODONE-ACETAMINOPHEN 5-325 MG PO TABS
1.0000 | ORAL_TABLET | Freq: Two times a day (BID) | ORAL | 0 refills | Status: DC | PRN
Start: 2020-11-13 — End: 2020-12-13

## 2020-11-13 NOTE — Telephone Encounter (Signed)
Ok to refill??  Last office visit 08/21/2020.  Last refill 10/17/2020.

## 2020-11-13 NOTE — Telephone Encounter (Signed)
Ok to refill??  Last office visit 08/21/2020.  Last refill 07/14/2020, #3 refills.

## 2020-12-07 ENCOUNTER — Encounter: Payer: Self-pay | Admitting: Family Medicine

## 2020-12-07 ENCOUNTER — Other Ambulatory Visit: Payer: Self-pay

## 2020-12-07 ENCOUNTER — Other Ambulatory Visit: Payer: Self-pay | Admitting: Family Medicine

## 2020-12-07 ENCOUNTER — Telehealth: Payer: Self-pay

## 2020-12-07 NOTE — Telephone Encounter (Signed)
Disregard, sent correct request to pcp

## 2020-12-13 ENCOUNTER — Encounter: Payer: Self-pay | Admitting: Family Medicine

## 2020-12-13 ENCOUNTER — Ambulatory Visit (INDEPENDENT_AMBULATORY_CARE_PROVIDER_SITE_OTHER): Payer: 59 | Admitting: Family Medicine

## 2020-12-13 ENCOUNTER — Other Ambulatory Visit: Payer: Self-pay

## 2020-12-13 VITALS — BP 138/92 | HR 95 | Temp 97.7°F | Ht 68.0 in | Wt 274.4 lb

## 2020-12-13 DIAGNOSIS — M5136 Other intervertebral disc degeneration, lumbar region: Secondary | ICD-10-CM | POA: Diagnosis not present

## 2020-12-13 DIAGNOSIS — E782 Mixed hyperlipidemia: Secondary | ICD-10-CM | POA: Diagnosis not present

## 2020-12-13 DIAGNOSIS — F411 Generalized anxiety disorder: Secondary | ICD-10-CM

## 2020-12-13 DIAGNOSIS — E669 Obesity, unspecified: Secondary | ICD-10-CM

## 2020-12-13 DIAGNOSIS — R7303 Prediabetes: Secondary | ICD-10-CM

## 2020-12-13 DIAGNOSIS — I10 Essential (primary) hypertension: Secondary | ICD-10-CM

## 2020-12-13 DIAGNOSIS — M51369 Other intervertebral disc degeneration, lumbar region without mention of lumbar back pain or lower extremity pain: Secondary | ICD-10-CM

## 2020-12-13 DIAGNOSIS — E66813 Obesity, class 3: Secondary | ICD-10-CM

## 2020-12-13 MED ORDER — HYDROCODONE-ACETAMINOPHEN 5-325 MG PO TABS: 1.0000 | ORAL_TABLET | Freq: Four times a day (QID) | ORAL | 0 refills | Status: AC | PRN

## 2020-12-13 MED ORDER — HYDROCODONE-ACETAMINOPHEN 5-325 MG PO TABS
1.0000 | ORAL_TABLET | Freq: Two times a day (BID) | ORAL | 0 refills | Status: AC | PRN
Start: 2020-12-13 — End: ?

## 2020-12-13 MED ORDER — ATORVASTATIN CALCIUM 10 MG PO TABS: 10.0000 mg | ORAL_TABLET | Freq: Every day | ORAL | 1 refills | Status: AC

## 2020-12-13 MED ORDER — LOSARTAN POTASSIUM 50 MG PO TABS: 50.0000 mg | ORAL_TABLET | Freq: Every day | ORAL | 1 refills | Status: AC

## 2020-12-13 MED ORDER — CYCLOBENZAPRINE HCL 10 MG PO TABS
ORAL_TABLET | ORAL | 1 refills | Status: AC
Start: 2020-12-13 — End: ?

## 2020-12-13 MED ORDER — ALPRAZOLAM 1 MG PO TABS: 1.0000 mg | ORAL_TABLET | Freq: Three times a day (TID) | ORAL | 2 refills | Status: AC | PRN

## 2020-12-13 MED ORDER — TAMSULOSIN HCL 0.4 MG PO CAPS: 0.4000 mg | ORAL_CAPSULE | Freq: Every day | ORAL | 1 refills | Status: AC

## 2020-12-13 MED ORDER — ESOMEPRAZOLE MAGNESIUM 40 MG PO CPDR: 40.0000 mg | DELAYED_RELEASE_CAPSULE | ORAL | 1 refills | Status: AC

## 2020-12-13 NOTE — Progress Notes (Signed)
   Subjective:    Patient ID: Dennis Crosby, male    DOB: [birth year], 55 y.o.   MRN: [id number]  Patient presents for Medication Refill (Has new pcp, needs meds to last until 1st appt)  Patient here for follow-up medications.  He is fasting.  He needs his medications refilled to establish this with new PCP is only in the practice.  Major depression he is doing well on his Lexapro he also has underlying anxiety and takes alprazolam up to 3 times a day.  Chronic back pain with sciatica/DDD  he takes hydrocodone sparingly does not take at the same time as his benzo.  Gets 30 tablets/month. He gets adjustments from chiropracter as well   Hypertension he is taking his blood pressure medicine as prescribed.  On his last set of labs in November his A1c was in the prediabetic range at 6%.  His cholesterol is also elevated he needs to have this rechecked today.  He admits that he had not been following lower carb diet   Review Of Systems:  GEN- denies fatigue, fever, weight loss,weakness, recent illness HEENT- denies eye drainage, change in vision, nasal discharge, CVS- denies chest pain, palpitations RESP- denies SOB, cough, wheeze ABD- denies N/V, change in stools, abd pain GU- denies dysuria, hematuria, dribbling, incontinence MSK- + joint pain, muscle aches, injury Neuro- denies headache, dizziness, syncope, seizure activity       Objective:    BP (!) 138/92   Pulse 95   Temp 97.7 F (36.5 C)   Ht 5\' 8"  (1.727 m)   Wt 274 lb 6.4 oz (124.5 kg)   SpO2 97%   BMI 41.72 kg/m  GEN- NAD, alert and oriented x3, very pleasant HEENT- PERRL, EOMI, non injected sclera, pink conjunctiva, MMM, oropharynx clear Neck- Supple, no thyromegaly CVS- RRR, no murmur RESP-CTAB ABD-NABS,soft,NT,ND PSYCH normal affect and mood  EXT- No edema Pulses- Radial, DP- 2+        Assessment & Plan:      Problem List Items Addressed This Visit      Unprioritized   Class 3 obesity    Recheck  A1C/lipids Reduce fried foods, sugars in diet       DDD (degenerative disc disease), lumbar    Pain meds refilled , also given hard copy script for April      Relevant Medications   cyclobenzaprine (FLEXERIL) 10 MG tablet   HYDROcodone-acetaminophen (NORCO) 5-325 MG tablet   HYDROcodone-acetaminophen (NORCO) 5-325 MG tablet   GAD (generalized anxiety disorder)    Continue lexapro and xanax       Relevant Medications   ALPRAZolam (XANAX) 1 MG tablet   Hyperlipidemia   Relevant Medications   atorvastatin (LIPITOR) 10 MG tablet   losartan (COZAAR) 50 MG tablet   Other Relevant Orders   Lipid panel   Hypertension - Primary    Mild elevation, no changes Check labs       Relevant Medications   atorvastatin (LIPITOR) 10 MG tablet   losartan (COZAAR) 50 MG tablet   Other Relevant Orders   CBC with Differential/Platelet   Comprehensive metabolic panel   Prediabetes   Relevant Orders   Hemoglobin A1c      Note: This dictation was prepared with Dragon dictation along with smaller phrase technology. Any transcriptional errors that result from this process are unintentional.

## 2020-12-13 NOTE — Assessment & Plan Note (Signed)
Recheck A1C/lipids Reduce fried foods, sugars in diet

## 2020-12-13 NOTE — Assessment & Plan Note (Signed)
Pain meds refilled , also given hard copy script for April

## 2020-12-13 NOTE — Assessment & Plan Note (Signed)
Continue lexapro and xanax

## 2020-12-13 NOTE — Assessment & Plan Note (Signed)
Mild elevation, no changes Check labs

## 2020-12-14 LAB — COMPREHENSIVE METABOLIC PANEL
AG Ratio: 2.1 (calc) (ref 1.0–2.5)
ALT: 30 U/L (ref 9–46)
AST: 27 U/L (ref 10–35)
Albumin: 4.7 g/dL (ref 3.6–5.1)
Alkaline phosphatase (APISO): 89 U/L (ref 35–144)
BUN: 9 mg/dL (ref 7–25)
CO2: 25 mmol/L (ref 20–32)
Calcium: 9.4 mg/dL (ref 8.6–10.3)
Chloride: 101 mmol/L (ref 98–110)
Creat: 0.8 mg/dL (ref 0.70–1.33)
Globulin: 2.2 g/dL (calc) (ref 1.9–3.7)
Glucose, Bld: 96 mg/dL (ref 65–99)
Potassium: 3.5 mmol/L (ref 3.5–5.3)
Sodium: 138 mmol/L (ref 135–146)
Total Bilirubin: 0.6 mg/dL (ref 0.2–1.2)
Total Protein: 6.9 g/dL (ref 6.1–8.1)

## 2020-12-14 LAB — CBC WITH DIFFERENTIAL/PLATELET
Absolute Monocytes: 442 cells/uL (ref 200–950)
Basophils Absolute: 41 cells/uL (ref 0–200)
Basophils Relative: 0.6 %
Eosinophils Absolute: 190 cells/uL (ref 15–500)
Eosinophils Relative: 2.8 %
HCT: 44.2 % (ref 38.5–50.0)
Hemoglobin: 15.3 g/dL (ref 13.2–17.1)
Lymphs Abs: 1605 cells/uL (ref 850–3900)
MCH: 31.5 pg (ref 27.0–33.0)
MCHC: 34.6 g/dL (ref 32.0–36.0)
MCV: 91.1 fL (ref 80.0–100.0)
MPV: 10.3 fL (ref 7.5–12.5)
Monocytes Relative: 6.5 %
Neutro Abs: 4522 cells/uL (ref 1500–7800)
Neutrophils Relative %: 66.5 %
Platelets: 207 10*3/uL (ref 140–400)
RBC: 4.85 10*6/uL (ref 4.20–5.80)
RDW: 12.3 % (ref 11.0–15.0)
Total Lymphocyte: 23.6 %
WBC: 6.8 10*3/uL (ref 3.8–10.8)

## 2020-12-14 LAB — HEMOGLOBIN A1C
Hgb A1c MFr Bld: 6 % of total Hgb — ABNORMAL HIGH (ref <5.7)
Mean Plasma Glucose: 126 mg/dL
eAG (mmol/L): 7 mmol/L

## 2020-12-14 LAB — LIPID PANEL
Cholesterol: 183 mg/dL (ref <200)
HDL: 45 mg/dL (ref >=40)
LDL Cholesterol (Calc): 103 mg/dL (calc) — ABNORMAL HIGH
Non-HDL Cholesterol (Calc): 138 mg/dL (calc) — ABNORMAL HIGH (ref <130)
Total CHOL/HDL Ratio: 4.1 (calc) (ref <5.0)
Triglycerides: 228 mg/dL — ABNORMAL HIGH (ref <150)

## 2020-12-20 ENCOUNTER — Ambulatory Visit: Payer: 59 | Admitting: Family Medicine

## 2021-01-07 ENCOUNTER — Encounter: Payer: Self-pay | Admitting: Family Medicine

## 2021-07-30 ENCOUNTER — Institutional Professional Consult (permissible substitution): Payer: 59 | Admitting: Neurology

## 2021-08-01 ENCOUNTER — Institutional Professional Consult (permissible substitution): Payer: 59 | Admitting: Neurology

## 2021-09-14 ENCOUNTER — Other Ambulatory Visit: Payer: Self-pay

## 2021-09-18 ENCOUNTER — Encounter: Payer: Self-pay | Admitting: *Deleted

## 2021-09-19 ENCOUNTER — Ambulatory Visit: Payer: 59 | Admitting: Neurology

## 2021-09-19 ENCOUNTER — Encounter: Payer: Self-pay | Admitting: Neurology

## 2021-09-19 VITALS — BP 120/78 | HR 80 | Ht 69.0 in | Wt 271.2 lb

## 2021-09-19 DIAGNOSIS — R0683 Snoring: Secondary | ICD-10-CM | POA: Diagnosis not present

## 2021-09-19 DIAGNOSIS — G4761 Periodic limb movement disorder: Secondary | ICD-10-CM

## 2021-09-19 DIAGNOSIS — G4719 Other hypersomnia: Secondary | ICD-10-CM

## 2021-09-19 DIAGNOSIS — Z820 Family history of epilepsy and other diseases of the nervous system: Secondary | ICD-10-CM

## 2021-09-19 DIAGNOSIS — R0681 Apnea, not elsewhere classified: Secondary | ICD-10-CM

## 2021-09-19 DIAGNOSIS — R351 Nocturia: Secondary | ICD-10-CM

## 2021-09-19 DIAGNOSIS — Z79891 Long term (current) use of opiate analgesic: Secondary | ICD-10-CM

## 2021-09-19 NOTE — Patient Instructions (Signed)

## 2021-09-19 NOTE — Progress Notes (Signed)
Subjective:    Patient ID: Dennis Crosby is a 55 y.o. male.  HPI    Star Age, MD, PhD [facility] [address], [address] [place], [place] [address]  Dear Dr. Francesco Sor,   I saw your patient, Dennis Crosby, upon your kind request in my sleep clinic today for initial consultation of his sleep disorder, in particular, concern for underlying obstructive sleep apnea.  The patient is accompanied by his wife today.  As you know, Dennis Crosby is a 55 year old right-handed gentleman with an underlying medical history of reflux disease, hypertension, hyperlipidemia, depression, anxiety, degenerative disc disease, and obesity, who reports snoring and excessive daytime somnolence, as well as witnessed apneas per wife's report.  His snoring is loud and disturbs her.  Sometimes she cannot sleep in the same bedroom with him.  His brother has sleep apnea and has a CPAP machine.  I reviewed your office note from 04/13/2021.  His Epworth sleepiness score is 15 out of 24, fatigue severity score is 59 out of 63. His bedtime is generally around 8 or 9 PM, rise time around 6:30 AM.  He has nocturia about once per average night, denies recurrent morning headaches but has leg twitching at night per wife's report.  He denies any telltale symptoms of restless leg syndrome.  Of note, recently weaned himself off of Lexapro about 3 months ago.  He works as a Engineer, building services.  He has been on narcotic pain medication, takes hydrocodone, twice daily typically and also takes Xanax 1 mg strength up to 3 times a day, typically at night.  He quit smoking some 4 years ago.  He denies regular alcohol use, drinks alcohol rarely.  He drinks caffeine in the form of soda, 1/day on average but multiple noncaffeinated sodas per day.  He does not drink a whole lot of water.   His Past Medical History Is Significant For: Past Medical History:  Diagnosis Date   Anxiety    Former smoker    quit 2021   GERD  (gastroesophageal reflux disease)    Hypertension    Melanoma ([place])     His Past Surgical History Is Significant For: Past Surgical History:  Procedure Laterality Date   SKIN CANCER EXCISION      His Family History Is Significant For: Family History  Problem Relation Age of Onset   Dementia Mother    Alzheimer's disease Mother    Heart disease Mother    Diabetes Mother    Peripheral vascular disease Father    Heart disease Father    Colon polyps Father    Peripheral Artery Disease Father    Mental illness Sister    Seizures Brother    Asthma Brother    Sleep apnea Brother    Bone cancer Paternal Grandmother    Lung cancer Paternal Grandmother    Colon cancer Neg Hx    Esophageal cancer Neg Hx    Rectal cancer Neg Hx    Stomach cancer Neg Hx     His Social History Is Significant For: Social History   Socioeconomic History   Marital status: Married    Spouse name: Not on file   Number of children: 1   Years of education: Not on file   Highest education level: Not on file  Occupational History   Not on file  Tobacco Use   Smoking status: Former    Types: Cigarettes    Quit date: 01/03/2019    Years since  quitting: 2.7   Smokeless tobacco: Never  Vaping Use   Vaping Use: Some days  Substance and Sexual Activity   Alcohol use: Yes    Alcohol/week: 3.0 standard drinks    Types: 3 Cans of beer per week    Comment: 2 per day   Drug use: No   Sexual activity: Yes  Other Topics Concern   Not on file  Social History Narrative   Not on file   Social Determinants of Health   Financial Resource Strain: Not on file  Food Insecurity: Not on file  Transportation Needs: Not on file  Physical Activity: Not on file  Stress: Not on file  Social Connections: Not on file    His Allergies Are:  No Known Allergies:   His Current Medications Are:  Outpatient Encounter Medications as of 09/19/2021  Medication Sig   ALPRAZolam (XANAX) 1 MG tablet Take 1 tablet (1 mg  total) by mouth 3 (three) times daily as needed.   atorvastatin (LIPITOR) 10 MG tablet Take 1 tablet (10 mg total) by mouth daily.   cyclobenzaprine (FLEXERIL) 10 MG tablet TAKE 1 TABLET BY MOUTH THREE TIMES A DAY AS NEEDED FOR MUSCLE SPASMS   esomeprazole (NEXIUM) 40 MG capsule Take 1 capsule (40 mg total) by mouth every morning.   HYDROcodone-acetaminophen (NORCO) 5-325 MG tablet Take 1 tablet by mouth 2 (two) times daily as needed for moderate pain.   HYDROcodone-acetaminophen (NORCO) 5-325 MG tablet Take 1 tablet by mouth every 6 (six) hours as needed for moderate pain.   losartan (COZAAR) 50 MG tablet Take 1 tablet (50 mg total) by mouth daily.   tamsulosin (FLOMAX) 0.4 MG CAPS capsule Take 1 capsule (0.4 mg total) by mouth daily.   escitalopram (LEXAPRO) 10 MG tablet TAKE 1 TABLET BY MOUTH EVERYDAY AT BEDTIME   No facility-administered encounter medications on file as of 09/19/2021.  :   Review of Systems:  Out of a complete 14 point review of systems, all are reviewed and negative with the exception of these symptoms as listed below:  Review of Systems  Neurological:        Pt is for Sleep consult. Pt snores at night fatigue throughout the day and Hypertension. Pt denies headaches and sleep study and CPAP at home   ESS:15 FSS:59   Objective:  Neurological Exam  Physical Exam Physical Examination:   Vitals:   09/19/21 1345  BP: 120/78  Pulse: 80    General Examination: The patient is a very pleasant 55 y.o. male in no acute distress. He appears well-developed and well-nourished and well groomed.   HEENT: Normocephalic, atraumatic, pupils are equal, round and reactive to light, extraocular tracking is good without limitation to gaze excursion or nystagmus noted. Hearing is grossly intact. Face is symmetric with normal facial animation. Speech is clear with no dysarthria noted. There is no hypophonia. There is no lip, neck/head, jaw or voice tremor. Neck is supple with full  range of passive and active motion. There are no carotid bruits on auscultation. Oropharynx exam reveals: moderate mouth dryness, adequate dental hygiene with full dentures on top and partial plate on the bottom, marked airway crowding secondary to small airway, thicker soft palate, larger tongue and large uvula noted, tonsils about 1+ bilaterally.  Mallampati class III.  Neck circumference of 18 7/8 inches.  He has a mild overbite.  Tongue protrudes centrally and palate elevates symmetrically.  Chest: Clear to auscultation without wheezing, rhonchi or crackles noted.  Heart:  S1+S2+0, regular and normal without murmurs, rubs or gallops noted.   Abdomen: Soft, non-tender and non-distended with normal bowel sounds appreciated on auscultation.  Extremities: There is no pitting edema in the distal lower extremities bilaterally.   Skin: Warm and dry without trophic changes noted.   Musculoskeletal: exam reveals no obvious joint deformities, tenderness or joint swelling or erythema.   Neurologically:  Mental status: The patient is awake, alert and oriented in all 4 spheres. His immediate and remote memory, attention, language skills and fund of knowledge are appropriate. There is no evidence of aphasia, agnosia, apraxia or anomia. Speech is clear with normal prosody and enunciation. Thought process is linear. Mood is normal and affect is normal.  Cranial nerves II - XII are as described above under HEENT exam.  Motor exam: Normal bulk, strength and tone is noted. There is no tremor, Romberg is negative. Reflexes are 2+ throughout. Fine motor skills and coordination: grossly intact.  Cerebellar testing: No dysmetria or intention tremor. There is no truncal or gait ataxia.  Sensory exam: intact to light touch in the upper and lower extremities.  Gait, station and balance: He stands easily. No veering to one side is noted. No leaning to one side is noted. Posture is age-appropriate and stance is narrow  based. Gait shows normal stride length and normal pace. No problems turning are noted.   Assessment and Plan:   In summary, Terique Matranga is a very pleasant 55 y.o.-year old male with an underlying medical history of reflux disease, hypertension, hyperlipidemia, depression, anxiety, degenerative disc disease, and obesity, whose history and physical exam are  concerning for obstructive sleep apnea (OSA). I had a long chat with the patient and his wife about my findings and the diagnosis of OSA, its prognosis and treatment options. We talked about medical treatments, surgical interventions and non-pharmacological approaches. I explained in particular the risks and ramifications of untreated moderate to severe OSA, especially with respect to developing cardiovascular disease down the Road, including congestive heart failure, difficult to treat hypertension, cardiac arrhythmias, or stroke. Even type 2 diabetes has, in part, been linked to untreated OSA. Symptoms of untreated OSA include daytime sleepiness, memory problems, mood irritability and mood disorder such as depression and anxiety, lack of energy, as well as recurrent headaches, especially morning headaches. We talked about trying to maintain a healthy lifestyle in general, as well as the importance of weight control. We also talked about the importance of good sleep hygiene. I recommended the following at this time: sleep study.  I have outlined the difference between a laboratory attended sleep study versus home sleep test.  It sounds like he has.  It leg movements of sleep without telltale symptoms of underlying restless leg syndrome.  PLM's can be seen in patients who have sleep apnea but also in patients who take SSRI type antidepressant medications.  Of note, he tapered himself off of his Lexapro about 3 months ago. I explained the sleep test procedure to the patient and also outlined possible surgical and non-surgical treatment options of OSA,  including the use of a custom-made dental device (which would require a referral to a specialist dentist or oral surgeon), upper airway surgical options, such as traditional UPPP or a novel less invasive surgical option in the form of Inspire hypoglossal nerve stimulation (which would involve a referral to an ENT surgeon). I also explained the CPAP treatment option to the patient, who indicated that he would be willing to try CPAP if the  need arises. I answered all their questions today and the patient and his wife were in agreement. I plan to see him back after the sleep study is completed and encouraged him to call with any interim questions, concerns, problems or updates.   Thank you very much for allowing me to participate in the care of this nice patient. If I can be of any further assistance to you please do not hesitate to call me at [phone].  Sincerely,   Star Age, MD, PhD

## 2021-10-01 ENCOUNTER — Ambulatory Visit (INDEPENDENT_AMBULATORY_CARE_PROVIDER_SITE_OTHER): Payer: 59 | Admitting: Neurology

## 2021-10-01 DIAGNOSIS — Z820 Family history of epilepsy and other diseases of the nervous system: Secondary | ICD-10-CM

## 2021-10-01 DIAGNOSIS — G4761 Periodic limb movement disorder: Secondary | ICD-10-CM

## 2021-10-01 DIAGNOSIS — G4733 Obstructive sleep apnea (adult) (pediatric): Secondary | ICD-10-CM

## 2021-10-01 DIAGNOSIS — R0683 Snoring: Secondary | ICD-10-CM | POA: Diagnosis not present

## 2021-10-01 DIAGNOSIS — G4719 Other hypersomnia: Secondary | ICD-10-CM

## 2021-10-01 DIAGNOSIS — G4734 Idiopathic sleep related nonobstructive alveolar hypoventilation: Secondary | ICD-10-CM

## 2021-10-01 DIAGNOSIS — R0681 Apnea, not elsewhere classified: Secondary | ICD-10-CM

## 2021-10-01 DIAGNOSIS — R351 Nocturia: Secondary | ICD-10-CM

## 2021-10-01 DIAGNOSIS — Z79891 Long term (current) use of opiate analgesic: Secondary | ICD-10-CM

## 2021-10-02 NOTE — Progress Notes (Signed)
° °  [facility]  HOME SLEEP TEST (Watch PAT) REPORT  STUDY DATE: 10/01/2021  DOB: 14-May-1966  MRN: [id number]  ORDERING CLINICIAN: Star Age, MD, PhD   REFERRING CLINICIAN: Sueanne Margarita, DO   CLINICAL INFORMATION/HISTORY: 55 year old right-handed gentleman with an underlying medical history of reflux disease, hypertension, hyperlipidemia, depression, anxiety, degenerative disc disease, and obesity, who reports snoring and excessive daytime somnolence, as well as witnessed apneas.  Epworth sleepiness score: 15/24.  BMI: 40.2 kg/m  FINDINGS:   Sleep Summary:   Total Recording Time (hours, min): 8 hours, 55 minutes  Total Sleep Time (hours, min):  8 hours, 23 minutes   Percent REM (%):    6.5%   Respiratory Indices:   Calculated pAHI (per hour):  75.3/hour         REM pAHI:    69/hour       NREM pAHI: 75.8/hour  Oxygen Saturation Statistics:    Oxygen Saturation (%) Mean: 88%   Minimum oxygen saturation (%):                 65%   O2 Saturation Range (%): 65-99%    O2 Saturation (minutes) <=88%: 263.2 min  Pulse Rate Statistics:   Pulse Mean (bpm):    88/min    Pulse Range (44-111/min)   IMPRESSION: OSA (obstructive sleep apnea), severe Nocturnal hypoxemia  RECOMMENDATION:  This home sleep test demonstrates severe obstructive sleep apnea with a total AHI of 75.3/hour and O2 nadir of 65% with significant time below 88% saturation of 263.2 minutes, indicating nocturnal hypoxemia. Treatment with positive airway pressure is highly recommended. This will require - ideally - a full night CPAP titration study for proper treatment settings, O2 monitoring and mask fitting. For now, the patient will be advised to proceed with an autoPAP titration/trial at home.  A laboratory attended titration study can be considered in the future for optimization of his treatment and better tolerance of therapy.  Alternative treatment options are limited secondary to  the severity of the patient's sleep disordered breathing.  Concomitant weight loss is highly recommended. Please note, that untreated obstructive sleep apnea may carry additional perioperative morbidity. Patients with significant obstructive sleep apnea should receive perioperative PAP therapy and the surgeons and particularly the anesthesiologist should be informed of the diagnosis and the severity of the sleep disordered breathing. The patient should be cautioned not to drive, work at heights, or operate dangerous or heavy equipment when tired or sleepy. Review and reiteration of good sleep hygiene measures should be pursued with any patient. Other causes of the patient's symptoms, including circadian rhythm disturbances, an underlying mood disorder, medication effect and/or an underlying medical problem cannot be ruled out based on this test. Clinical correlation is recommended. The patient and his referring provider will be notified of the test results. The patient will be seen in follow up in sleep clinic at [facility].  I certify that I have reviewed the raw data recording prior to the issuance of this report in accordance with the standards of the American Academy of Sleep Medicine (AASM).   INTERPRETING PHYSICIAN:   Star Age, MD, PhD  Board Certified in Neurology and Sleep Medicine  [facility] [address], [place], [place] [address] [phone]

## 2021-10-16 ENCOUNTER — Telehealth: Payer: Self-pay | Admitting: *Deleted

## 2021-10-16 NOTE — Telephone Encounter (Signed)
-----   Message from Star Age, MD sent at 10/16/2021 11:14 AM EST ----- Patient referred by Dr. Francesco Sor, seen by me on 09/19/2021, patient had a HST on 10/01/2021.    Due to severe sleep apnea, please prioritize.  Please call and notify the patient that the recent home sleep test showed obstructive sleep apnea in the severe range. I recommend treatment for this in the form of autoPAP, which means, that we don't have to bring him in for a sleep study with CPAP, but will let him start using a so called autoPAP machine at home, through a DME company (of his choice, or as per insurance requirement). The DME representative will fit the patient with a mask of choice, educate him on how to use the machine, how to put the mask on, etc. I have placed an order in the chart. Please send the order to a local DME, talk to patient, send report to referring MD. Please also reinforce the need for compliance with treatment. We will need a FU in sleep clinic for 10 weeks post-PAP set up, please arrange that with me or one of our NPs.   Thanks,   Star Age, MD, PhD [facility])

## 2021-10-16 NOTE — Procedures (Signed)
° °  [facility]  HOME SLEEP TEST (Watch PAT) REPORT  STUDY DATE: 10/01/2021  DOB: Jan 29, 1966  MRN: [id number]  ORDERING CLINICIAN: Star Age, MD, PhD   REFERRING CLINICIAN: Sueanne Margarita, DO   CLINICAL INFORMATION/HISTORY: 56 year old right-handed gentleman with an underlying medical history of reflux disease, hypertension, hyperlipidemia, depression, anxiety, degenerative disc disease, and obesity, who reports snoring and excessive daytime somnolence, as well as witnessed apneas.  Epworth sleepiness score: 15/24.  BMI: 40.2 kg/m  FINDINGS:   Sleep Summary:   Total Recording Time (hours, min): 8 hours, 55 minutes  Total Sleep Time (hours, min):  8 hours, 23 minutes   Percent REM (%):    6.5%   Respiratory Indices:   Calculated pAHI (per hour):  75.3/hour         REM pAHI:    69/hour       NREM pAHI: 75.8/hour  Oxygen Saturation Statistics:    Oxygen Saturation (%) Mean: 88%   Minimum oxygen saturation (%):                 65%   O2 Saturation Range (%): 65-99%    O2 Saturation (minutes) <=88%: 263.2 min  Pulse Rate Statistics:   Pulse Mean (bpm):    88/min    Pulse Range (44-111/min)   IMPRESSION: OSA (obstructive sleep apnea), severe Nocturnal hypoxemia  RECOMMENDATION:  This home sleep test demonstrates severe obstructive sleep apnea with a total AHI of 75.3/hour and O2 nadir of 65% with significant time below 88% saturation of 263.2 minutes, indicating nocturnal hypoxemia. Treatment with positive airway pressure is highly recommended. This will require - ideally - a full night CPAP titration study for proper treatment settings, O2 monitoring and mask fitting. For now, the patient will be advised to proceed with an autoPAP titration/trial at home.  A laboratory attended titration study can be considered in the future for optimization of his treatment and better tolerance of therapy.  Alternative treatment options are limited secondary to  the severity of the patient's sleep disordered breathing.  Concomitant weight loss is highly recommended. Please note, that untreated obstructive sleep apnea may carry additional perioperative morbidity. Patients with significant obstructive sleep apnea should receive perioperative PAP therapy and the surgeons and particularly the anesthesiologist should be informed of the diagnosis and the severity of the sleep disordered breathing. The patient should be cautioned not to drive, work at heights, or operate dangerous or heavy equipment when tired or sleepy. Review and reiteration of good sleep hygiene measures should be pursued with any patient. Other causes of the patient's symptoms, including circadian rhythm disturbances, an underlying mood disorder, medication effect and/or an underlying medical problem cannot be ruled out based on this test. Clinical correlation is recommended. The patient and his referring provider will be notified of the test results. The patient will be seen in follow up in sleep clinic at [facility].  I certify that I have reviewed the raw data recording prior to the issuance of this report in accordance with the standards of the American Academy of Sleep Medicine (AASM).   INTERPRETING PHYSICIAN:   Star Age, MD, PhD  Board Certified in Neurology and Sleep Medicine  [facility] [address], [place], [place] [address] [phone]

## 2021-10-16 NOTE — Telephone Encounter (Signed)
Spoke with patient and discussed the sleep study results.  He understands his home sleep test showed severe obstructive sleep apnea.  He is agreeable to start using an AutoPap.  He has new insurance this year but he was having trouble finding his card so he will get with his wife and send [place] a picture of the front and the back of the card through [employer].  We can then proceed with sending the orders over to a durable medical equipment company.  Patient understands insurance compliance requirements which includes using the machine at least 4 hours every night and also to be seen in the office 31 to 90 days after he starts using his new machine.  We will hold on scheduling the follow-up at this time as well until the orders have been successfully sent to a DME company.  I told the patient due to his severe OSA we would like to move as quickly as possible to get him set up so I will be looking for a message from him tomorrow.  He was very appreciation for the call and his questions were answered.

## 2021-10-16 NOTE — Addendum Note (Signed)
Addended by: Star Age on: 10/16/2021 11:16 AM   Modules accepted: Orders

## 2021-10-18 NOTE — Telephone Encounter (Signed)
I called the patient and LVM (ok per DPR) asking for him to send [place] a picture of his insurance card, front and back, through my chart so we can send the orders for AutoPap to the DME company.

## 2021-10-22 ENCOUNTER — Encounter: Payer: Self-pay | Admitting: *Deleted

## 2021-10-22 NOTE — Telephone Encounter (Signed)
Faxed autoPAP order, sleep study, office note, and insurance/demographic information to [place]. Received a receipt of confirmation.

## 2021-11-12 NOTE — Telephone Encounter (Signed)
Pls. Offer FU appt so we can discuss.  Since he has severe OSA, I would not recommend, he leave his OSA untreated, due to associated health risks including cardiovascular disease risks.  We can also look into referrals to ENT and/or Dentistry for alternative treatment options but, generally speaking, for severe sleep apnea, a CPAP or AutoPap or BiPAP are recommended first-line options.  Please call pt back to advise of the above.

## 2021-11-12 NOTE — Telephone Encounter (Signed)
Received a fax from [facility] stating the patient decided not to move forward with CPAP.

## 2021-11-12 NOTE — Telephone Encounter (Signed)
Called pt & LVM (ok per DPR) offering a f/u appt so he can discuss with Dr Rexene Alberts alternative treatment options for CPAP and discuss his concerns.  Advised that since he has severe OSA, Dr Rexene Alberts would not recommend he leave his OSA untreated, due to associated health risks including cardiovascular disease risks. Advised we can also look into referrals to ENT and/or Dentistry for alternative treatment options but, generally speaking, for severe sleep apnea, a CPAP or AutoPap or BiPAP are recommended first-line options. Left office number for pt to call back and schedule a follow-up.

## 2021-12-19 DIAGNOSIS — E785 Hyperlipidemia, unspecified: Secondary | ICD-10-CM | POA: Diagnosis not present

## 2021-12-19 DIAGNOSIS — I10 Essential (primary) hypertension: Secondary | ICD-10-CM | POA: Diagnosis not present

## 2021-12-19 DIAGNOSIS — Z125 Encounter for screening for malignant neoplasm of prostate: Secondary | ICD-10-CM | POA: Diagnosis not present

## 2021-12-26 DIAGNOSIS — R82998 Other abnormal findings in urine: Secondary | ICD-10-CM | POA: Diagnosis not present

## 2022-01-10 DIAGNOSIS — M79642 Pain in left hand: Secondary | ICD-10-CM | POA: Diagnosis not present

## 2022-01-10 DIAGNOSIS — G8929 Other chronic pain: Secondary | ICD-10-CM | POA: Diagnosis not present

## 2022-01-10 DIAGNOSIS — M545 Low back pain, unspecified: Secondary | ICD-10-CM | POA: Diagnosis not present

## 2022-06-26 DIAGNOSIS — Z23 Encounter for immunization: Secondary | ICD-10-CM | POA: Diagnosis not present

## 2022-08-29 ENCOUNTER — Other Ambulatory Visit (HOSPITAL_COMMUNITY): Payer: Self-pay

## 2022-08-29 MED ORDER — HYDROCODONE-ACETAMINOPHEN 10-325 MG PO TABS
0.5000 | ORAL_TABLET | Freq: Four times a day (QID) | ORAL | 0 refills | Status: DC | PRN
  Filled 2022-08-29: qty 60, 30d supply, fill #0

## 2022-08-30 ENCOUNTER — Other Ambulatory Visit (HOSPITAL_COMMUNITY): Payer: Self-pay

## 2022-10-10 ENCOUNTER — Other Ambulatory Visit (HOSPITAL_COMMUNITY): Payer: Self-pay

## 2022-10-10 MED ORDER — HYDROCODONE-ACETAMINOPHEN 10-325 MG PO TABS
0.5000 | ORAL_TABLET | Freq: Four times a day (QID) | ORAL | 0 refills | Status: AC | PRN
Start: 2022-10-09 — End: ?
  Filled 2022-10-10: qty 60, 30d supply, fill #0

## 2022-12-25 DIAGNOSIS — R7989 Other specified abnormal findings of blood chemistry: Secondary | ICD-10-CM | POA: Diagnosis not present

## 2022-12-25 DIAGNOSIS — Z125 Encounter for screening for malignant neoplasm of prostate: Secondary | ICD-10-CM | POA: Diagnosis not present

## 2022-12-25 DIAGNOSIS — F419 Anxiety disorder, unspecified: Secondary | ICD-10-CM | POA: Diagnosis not present

## 2022-12-25 DIAGNOSIS — I10 Essential (primary) hypertension: Secondary | ICD-10-CM | POA: Diagnosis not present

## 2022-12-25 DIAGNOSIS — E785 Hyperlipidemia, unspecified: Secondary | ICD-10-CM | POA: Diagnosis not present

## 2023-01-01 DIAGNOSIS — N401 Enlarged prostate with lower urinary tract symptoms: Secondary | ICD-10-CM | POA: Diagnosis not present

## 2023-01-01 DIAGNOSIS — M545 Low back pain, unspecified: Secondary | ICD-10-CM | POA: Diagnosis not present

## 2023-01-01 DIAGNOSIS — Z6838 Body mass index (BMI) 38.0-38.9, adult: Secondary | ICD-10-CM | POA: Diagnosis not present

## 2023-01-01 DIAGNOSIS — Z1331 Encounter for screening for depression: Secondary | ICD-10-CM | POA: Diagnosis not present

## 2023-01-01 DIAGNOSIS — R7303 Prediabetes: Secondary | ICD-10-CM | POA: Diagnosis not present

## 2023-01-01 DIAGNOSIS — F419 Anxiety disorder, unspecified: Secondary | ICD-10-CM | POA: Diagnosis not present

## 2023-01-01 DIAGNOSIS — Z1339 Encounter for screening examination for other mental health and behavioral disorders: Secondary | ICD-10-CM | POA: Diagnosis not present

## 2023-01-01 DIAGNOSIS — R5383 Other fatigue: Secondary | ICD-10-CM | POA: Diagnosis not present

## 2023-01-01 DIAGNOSIS — E785 Hyperlipidemia, unspecified: Secondary | ICD-10-CM | POA: Diagnosis not present

## 2023-01-01 DIAGNOSIS — Z0000 Encounter for general adult medical examination without abnormal findings: Secondary | ICD-10-CM | POA: Diagnosis not present

## 2023-01-01 DIAGNOSIS — G8929 Other chronic pain: Secondary | ICD-10-CM | POA: Diagnosis not present

## 2023-01-06 DIAGNOSIS — M5136 Other intervertebral disc degeneration, lumbar region: Secondary | ICD-10-CM | POA: Diagnosis not present

## 2023-01-06 DIAGNOSIS — M545 Low back pain, unspecified: Secondary | ICD-10-CM | POA: Diagnosis not present

## 2023-07-03 DIAGNOSIS — F419 Anxiety disorder, unspecified: Secondary | ICD-10-CM | POA: Diagnosis not present

## 2023-07-03 DIAGNOSIS — K921 Melena: Secondary | ICD-10-CM | POA: Diagnosis not present

## 2023-07-03 DIAGNOSIS — E785 Hyperlipidemia, unspecified: Secondary | ICD-10-CM | POA: Diagnosis not present

## 2023-07-03 DIAGNOSIS — Z6838 Body mass index (BMI) 38.0-38.9, adult: Secondary | ICD-10-CM | POA: Diagnosis not present

## 2023-07-03 DIAGNOSIS — N401 Enlarged prostate with lower urinary tract symptoms: Secondary | ICD-10-CM | POA: Diagnosis not present

## 2023-07-03 DIAGNOSIS — G8929 Other chronic pain: Secondary | ICD-10-CM | POA: Diagnosis not present

## 2023-07-03 DIAGNOSIS — Z23 Encounter for immunization: Secondary | ICD-10-CM | POA: Diagnosis not present

## 2023-07-03 DIAGNOSIS — R7303 Prediabetes: Secondary | ICD-10-CM | POA: Diagnosis not present

## 2023-07-03 DIAGNOSIS — R5383 Other fatigue: Secondary | ICD-10-CM | POA: Diagnosis not present

## 2023-07-03 DIAGNOSIS — M545 Low back pain, unspecified: Secondary | ICD-10-CM | POA: Diagnosis not present

## 2024-02-02 ENCOUNTER — Emergency Department (HOSPITAL_BASED_OUTPATIENT_CLINIC_OR_DEPARTMENT_OTHER)
Admission: EM | Admit: 2024-02-02 | Discharge: 2024-02-02 | Disposition: A | Discharge status: Home / Self Care | Payer: Worker's Compensation | Attending: Emergency Medicine | Admitting: Emergency Medicine

## 2024-02-02 ENCOUNTER — Other Ambulatory Visit: Payer: Self-pay

## 2024-02-02 ENCOUNTER — Emergency Department (HOSPITAL_BASED_OUTPATIENT_CLINIC_OR_DEPARTMENT_OTHER): Payer: Worker's Compensation | Admitting: Radiology

## 2024-02-02 ENCOUNTER — Encounter (HOSPITAL_BASED_OUTPATIENT_CLINIC_OR_DEPARTMENT_OTHER): Payer: Self-pay

## 2024-02-02 DIAGNOSIS — X501XXA Overexertion from prolonged static or awkward postures, initial encounter: Secondary | ICD-10-CM | POA: Insufficient documentation

## 2024-02-02 DIAGNOSIS — S46211A Strain of muscle, fascia and tendon of other parts of biceps, right arm, initial encounter: Secondary | ICD-10-CM | POA: Diagnosis not present

## 2024-02-02 DIAGNOSIS — Y990 Civilian activity done for income or pay: Secondary | ICD-10-CM | POA: Diagnosis not present

## 2024-02-02 DIAGNOSIS — S4991XA Unspecified injury of right shoulder and upper arm, initial encounter: Secondary | ICD-10-CM | POA: Diagnosis present

## 2024-02-02 MED ORDER — ACETAMINOPHEN 325 MG PO TABS
650.0000 mg | ORAL_TABLET | Freq: Once | ORAL | Status: AC
  Administered 2024-02-02: 650 mg via ORAL
  Filled 2024-02-02: qty 2

## 2024-02-02 MED ORDER — KETOROLAC TROMETHAMINE 60 MG/2ML IM SOLN
30.0000 mg | Freq: Once | INTRAMUSCULAR | Status: AC
  Administered 2024-02-02: 30 mg via INTRAMUSCULAR
  Filled 2024-02-02: qty 2

## 2024-02-02 MED ORDER — OXYCODONE-ACETAMINOPHEN 5-325 MG PO TABS: 1.0000 | ORAL_TABLET | Freq: Four times a day (QID) | ORAL | 0 refills | Status: AC | PRN

## 2024-02-02 MED ORDER — OXYCODONE-ACETAMINOPHEN 5-325 MG PO TABS
1.0000 | ORAL_TABLET | ORAL | Status: DC | PRN
  Administered 2024-02-02: 1 via ORAL
  Filled 2024-02-02: qty 1

## 2024-02-02 NOTE — ED Triage Notes (Signed)
 In for eval of right shoulder pain after prying something on a truck at approx 1100 today. Sent from [facility] urgent [facility] for further eval. Motor and sensation intact to right hand.

## 2024-02-02 NOTE — Discharge Instructions (Signed)
 You likely have a tear of your biceps tendon.  You can take 600 mg of ibuprofen  every 6 hours.  I have also sent you Percocet, which is oxycodone  and Tylenol  that you can take every 6 hours.  You can take 500-650 mg of Tylenol  every 8 hours.  Do not exceed this much Tylenol  in 1 day.  When you call [facility], you can tell them that you are seen in the emergency department and diagnosed with a likely biceps tendon tear.  They should be able to see you this week.

## 2024-02-02 NOTE — ED Provider Notes (Signed)
 [place] [facility] AT [facility] Provider Note   CSN: [id number] Arrival date & time: 02/02/24  1507     History  Chief Complaint  Patient presents with   Shoulder Injury    Dennis Crosby is a 58 y.o. male.  This is a 58 year old male who is here today with right shoulder pain after sudden onset of pain earlier today when he was trying to pry a 18 wheeler transmission up and into a truck.  Patient states that they were working on this heavy piece of equipment for several hours, when he felt a sudden pop in his right shoulder and upper arm.  Patient went to an urgent care who recommended he come to the ED for evaluation.   Shoulder Injury       Home Medications Prior to Admission medications   Medication Sig Start Date End Date Taking? Authorizing Provider  oxyCODONE -acetaminophen  (PERCOCET/ROXICET) 5-325 MG tablet Take 1 tablet by mouth every 6 (six) hours as needed for severe pain (pain score 7-10). 02/02/24  Yes Afton Horse T, DO  ALPRAZolam  (XANAX ) 1 MG tablet Take 1 tablet (1 mg total) by mouth 3 (three) times daily as needed. 12/13/20   Mathis Som, MD  atorvastatin  (LIPITOR) 10 MG tablet Take 1 tablet (10 mg total) by mouth daily. 12/13/20   Mathis Som, MD  cyclobenzaprine  (FLEXERIL ) 10 MG tablet TAKE 1 TABLET BY MOUTH THREE TIMES A DAY AS NEEDED FOR MUSCLE SPASMS 12/13/20   Mathis Som, MD  escitalopram  (LEXAPRO ) 10 MG tablet TAKE 1 TABLET BY MOUTH EVERYDAY AT BEDTIME 09/15/20   [place], Marvell Slider, MD  esomeprazole  (NEXIUM ) 40 MG capsule Take 1 capsule (40 mg total) by mouth every morning. 12/13/20   Mathis Som, MD  HYDROcodone -acetaminophen  [facility]) 10-325 MG tablet Take 0.5 tablets by mouth every 6 (six) hours as needed for moderate pain 10/09/22     HYDROcodone -acetaminophen  (NORCO) 5-325 MG tablet Take 1 tablet by mouth 2 (two) times daily as needed for moderate pain. 12/13/20   Mathis Som, MD  HYDROcodone -acetaminophen   (NORCO) 5-325 MG tablet Take 1 tablet by mouth every 6 (six) hours as needed for moderate pain. 12/13/20   Mathis Som, MD  losartan  (COZAAR ) 50 MG tablet Take 1 tablet (50 mg total) by mouth daily. 12/13/20   Mathis Som, MD  tamsulosin  (FLOMAX ) 0.4 MG CAPS capsule Take 1 capsule (0.4 mg total) by mouth daily. 12/13/20   Mathis Som, MD      Allergies    Patient has no known allergies.    Review of Systems   Review of Systems  Physical Exam Updated Vital Signs BP 139/86 (BP Location: Left Arm)   Pulse 77   Temp 98 F (36.7 C)   Resp 16   Ht 5\' 8"  (1.727 m)   Wt 113.9 kg   SpO2 97%   BMI 38.16 kg/m  Physical Exam Vitals reviewed.  Musculoskeletal:     Comments: No gross deformity.  Patient with obvious fasciculations in the right biceps muscle.  Patient not able to flex bicep against resistance, pain with resisted pronation.  Patient also with area of exquisite tenderness in the posterior right shoulder.  Humeral head appears in appropriate position, shoulder not squared off.  Neurological:     General: No focal deficit present.     Mental Status: He is alert.     ED Results / Procedures / Treatments   Labs (all labs ordered are  listed, but only abnormal results are displayed) Labs Reviewed - No data to display  EKG None  Radiology DG Shoulder Right Result Date: 02/02/2024 CLINICAL DATA:  Work injury. Right shoulder pain after pry Ang something on a truck for today. EXAM: RIGHT SHOULDER - 2+ VIEW COMPARISON:  None Available. FINDINGS: Mild inferior humeral head-neck junction and minimal inferior glenoid degenerative spurring. Moderate acromioclavicular joint space narrowing and peripheral osteophytosis. There is an apparent 15 mm ossicle overlying the mid humeral head on frontal view and just anterior to the humeral head on lateral view, possibly a loose body and possibly within the bicipital groove. There is a small bony excrescence extending up to 7 mm  medially from the proximal humeral diaphysis, possibly a sessile osteo chondroma with a craniocaudal base measuring up to 17 mm. No acute fracture or dislocation. The visualized portion of the right lung is unremarkable. IMPRESSION: 1. No acute fracture or dislocation. 2. Moderate acromioclavicular and mild glenohumeral osteoarthritis. 3. Possible 15 mm loose body within the bicipital groove. 4. Possible sessile osteochondroma of the proximal humeral diaphysis. Electronically Signed   By: Bertina Broccoli M.D.   On: 02/02/2024 18:08    Procedures Procedures    Medications Ordered in ED Medications  oxyCODONE -acetaminophen  (PERCOCET/ROXICET) 5-325 MG per tablet 1 tablet (1 tablet Oral Given 02/02/24 1541)  ketorolac  (TORADOL ) injection 30 mg (30 mg Intramuscular Given 02/02/24 1628)  acetaminophen  (TYLENOL ) tablet 650 mg (650 mg Oral Given 02/02/24 1627)    ED Course/ Medical Decision Making/ A&P                                 Medical Decision Making 58 year old male here today with right shoulder pain.  Differential diagnoses include triceps tendon injury, biceps tendon injury, rotator cuff injury.  Plan-patient's exam is consistent with a biceps tendon injury.  Likely also injured his triceps and rotator cuff given the amount of crying that he was doing on a heavy object over time.  Patient feels quite a bit better in a sling.  Will give him some Toradol , additional Tylenol .  He received oxycodone  at triage.  Patient pending formal read of x-ray.  Reassessment 6:20 PM-x-ray consistent with likely biceps tendon injury.  Discharged with orthopedic follow-up.  Amount and/or Complexity of Data Reviewed Radiology: ordered.  Risk OTC drugs. Prescription drug management.           Final Clinical Impression(s) / ED Diagnoses Final diagnoses:  Biceps tendon rupture, proximal, right, initial encounter    Rx / DC Orders ED Discharge Orders          Ordered     oxyCODONE -acetaminophen  (PERCOCET/ROXICET) 5-325 MG tablet  Every 6 hours PRN        02/02/24 1823              Afton Horse T, DO 02/02/24 1825

## 2024-03-15 ENCOUNTER — Encounter: Payer: Self-pay | Admitting: Internal Medicine
